# Patient Record
Sex: Female | Born: 1975 | Race: White | Hispanic: Yes | Marital: Married | Smoking: Never smoker
Health system: Southern US, Community
[De-identification: ages and names within clinical notes are randomized; demographics above are authoritative.]

## PROBLEM LIST (undated history)

## (undated) DIAGNOSIS — E0590 Thyrotoxicosis, unspecified without thyrotoxic crisis or storm: Secondary | ICD-10-CM

## (undated) DIAGNOSIS — N83209 Unspecified ovarian cyst, unspecified side: Secondary | ICD-10-CM

## (undated) DIAGNOSIS — Z789 Other specified health status: Secondary | ICD-10-CM

## (undated) DIAGNOSIS — R079 Chest pain, unspecified: Secondary | ICD-10-CM

## (undated) DIAGNOSIS — R110 Nausea: Secondary | ICD-10-CM

## (undated) DIAGNOSIS — R0602 Shortness of breath: Secondary | ICD-10-CM

## (undated) DIAGNOSIS — Z603 Acculturation difficulty: Secondary | ICD-10-CM

## (undated) DIAGNOSIS — J45909 Unspecified asthma, uncomplicated: Secondary | ICD-10-CM

## (undated) DIAGNOSIS — Z758 Other problems related to medical facilities and other health care: Secondary | ICD-10-CM

## (undated) DIAGNOSIS — R109 Unspecified abdominal pain: Secondary | ICD-10-CM

## (undated) DIAGNOSIS — I8393 Asymptomatic varicose veins of bilateral lower extremities: Secondary | ICD-10-CM

## (undated) DIAGNOSIS — G8929 Other chronic pain: Secondary | ICD-10-CM

## (undated) DIAGNOSIS — R4586 Emotional lability: Secondary | ICD-10-CM

## (undated) HISTORY — DX: Acculturation difficulty: Z60.3

## (undated) HISTORY — DX: Thyrotoxicosis, unspecified without thyrotoxic crisis or storm: E05.90

## (undated) HISTORY — DX: Unspecified ovarian cyst, unspecified side: N83.209

## (undated) HISTORY — DX: Other problems related to medical facilities and other health care: Z75.8

## (undated) HISTORY — DX: Unspecified abdominal pain: R10.9

## (undated) HISTORY — DX: Other chronic pain: G89.29

## (undated) HISTORY — DX: Shortness of breath: R06.02

## (undated) HISTORY — DX: Emotional lability: R45.86

## (undated) HISTORY — DX: Chest pain, unspecified: R07.9

## (undated) HISTORY — DX: Nausea: R11.0

## (undated) HISTORY — PX: MYOMECTOMY: SHX85

## (undated) HISTORY — DX: Asymptomatic varicose veins of bilateral lower extremities: I83.93

## (undated) HISTORY — DX: Other specified health status: Z78.9

## (undated) HISTORY — DX: Unspecified asthma, uncomplicated: J45.909

---

## 2018-06-08 ENCOUNTER — Emergency Department (HOSPITAL_COMMUNITY): Payer: No Typology Code available for payment source

## 2018-06-08 ENCOUNTER — Other Ambulatory Visit: Payer: Self-pay

## 2018-06-08 ENCOUNTER — Ambulatory Visit (INDEPENDENT_AMBULATORY_CARE_PROVIDER_SITE_OTHER): Payer: No Typology Code available for payment source | Admitting: Family Medicine

## 2018-06-08 ENCOUNTER — Encounter: Payer: Self-pay | Admitting: Family Medicine

## 2018-06-08 ENCOUNTER — Emergency Department (HOSPITAL_COMMUNITY)
Admission: EM | Admit: 2018-06-08 | Discharge: 2018-06-08 | Disposition: A | Discharge status: Home / Self Care | Payer: No Typology Code available for payment source | Attending: Emergency Medicine | Admitting: Emergency Medicine

## 2018-06-08 VITALS — BP 110/70 | HR 62 | Temp 97.5°F | Ht 62.0 in | Wt 145.0 lb

## 2018-06-08 DIAGNOSIS — R0602 Shortness of breath: Secondary | ICD-10-CM | POA: Insufficient documentation

## 2018-06-08 DIAGNOSIS — G8929 Other chronic pain: Secondary | ICD-10-CM

## 2018-06-08 DIAGNOSIS — E039 Hypothyroidism, unspecified: Secondary | ICD-10-CM | POA: Insufficient documentation

## 2018-06-08 DIAGNOSIS — Z7689 Persons encountering health services in other specified circumstances: Secondary | ICD-10-CM

## 2018-06-08 DIAGNOSIS — M549 Dorsalgia, unspecified: Secondary | ICD-10-CM | POA: Insufficient documentation

## 2018-06-08 DIAGNOSIS — Z79899 Other long term (current) drug therapy: Secondary | ICD-10-CM | POA: Insufficient documentation

## 2018-06-08 DIAGNOSIS — R079 Chest pain, unspecified: Secondary | ICD-10-CM | POA: Insufficient documentation

## 2018-06-08 DIAGNOSIS — Z09 Encounter for follow-up examination after completed treatment for conditions other than malignant neoplasm: Secondary | ICD-10-CM

## 2018-06-08 DIAGNOSIS — R110 Nausea: Secondary | ICD-10-CM | POA: Insufficient documentation

## 2018-06-08 DIAGNOSIS — Z0000 Encounter for general adult medical examination without abnormal findings: Secondary | ICD-10-CM

## 2018-06-08 DIAGNOSIS — Z131 Encounter for screening for diabetes mellitus: Secondary | ICD-10-CM

## 2018-06-08 LAB — CBC
HCT: 38.5 % (ref 36.0–46.0)
Hemoglobin: 12.4 g/dL (ref 12.0–15.0)
MCH: 26.8 pg (ref 26.0–34.0)
MCHC: 32.2 g/dL (ref 30.0–36.0)
MCV: 83.3 fL (ref 80.0–100.0)
Platelets: 275 10*3/uL (ref 150–400)
RBC: 4.62 MIL/uL (ref 3.87–5.11)
RDW: 12 % (ref 11.5–15.5)
WBC: 7.8 10*3/uL (ref 4.0–10.5)
nRBC: 0 % (ref 0.0–0.2)

## 2018-06-08 LAB — POCT URINALYSIS DIP (MANUAL ENTRY)
Bilirubin, UA: NEGATIVE
Blood, UA: NEGATIVE
Glucose, UA: NEGATIVE mg/dL
Ketones, POC UA: NEGATIVE mg/dL
Leukocytes, UA: NEGATIVE
Nitrite, UA: NEGATIVE
Protein Ur, POC: NEGATIVE mg/dL
Spec Grav, UA: >=1.03 — AB (ref 1.010–1.025)
Urobilinogen, UA: 0.2 E.U./dL
pH, UA: 5.5 (ref 5.0–8.0)

## 2018-06-08 LAB — BASIC METABOLIC PANEL
Anion gap: 10 (ref 5–15)
BUN: 8 mg/dL (ref 6–20)
CO2: 22 mmol/L (ref 22–32)
Calcium: 9 mg/dL (ref 8.9–10.3)
Chloride: 106 mmol/L (ref 98–111)
Creatinine, Ser: 0.71 mg/dL (ref 0.44–1.00)
GFR calc Af Amer: >60 mL/min (ref >60)
GFR calc non Af Amer: >60 mL/min (ref >60)
Glucose, Bld: 78 mg/dL (ref 70–99)
Potassium: 3.6 mmol/L (ref 3.5–5.1)
Sodium: 138 mmol/L (ref 135–145)

## 2018-06-08 LAB — POCT GLYCOSYLATED HEMOGLOBIN (HGB A1C): Hemoglobin A1C: 5.5 % (ref 4.0–5.6)

## 2018-06-08 LAB — TROPONIN I: Troponin I: <0.03 ng/mL (ref <0.03)

## 2018-06-08 LAB — POCT URINE PREGNANCY: Preg Test, Ur: NEGATIVE

## 2018-06-08 MED ORDER — NITROGLYCERIN 0.4 MG SL SUBL: 0.4000 mg | SUBLINGUAL_TABLET | SUBLINGUAL | 3 refills | Status: DC | PRN

## 2018-06-08 MED ORDER — ONDANSETRON HCL 4 MG PO TABS: 4.0000 mg | ORAL_TABLET | Freq: Three times a day (TID) | ORAL | 2 refills | Status: DC | PRN

## 2018-06-08 NOTE — ED Provider Notes (Signed)
[facility]   CSN: [id number] Arrival date & time: 06/08/18  1553    History   Chief Complaint Chief Complaint  Patient presents with  . Chest Pain    HPI Ronni RumbleGriselda Concepcion is a 43 y.o. female.     Patient is a 43 year old female with past medical history of chest pain, hypothyroidism who presents emergency department for chest pain.  Patient reports that this is been going on greater than 5 months.  Reports that it comes on randomly and is sharp and lasts for just a few seconds.  Reports that it is so painful it takes her breath away for a few seconds but then resolves on its own.  No exacerbating or relieving factors.  Reports that [place] and was told to follow-up with a cardiologist but never followed up.  Reports that she was at her primary care doctor's office today and when was having EKG done and reports that she had the pain while she was getting the EKG and her primary care doctor told her to come to the emergency department.  She currently has the pain well localized in the left side of her upper back and the right side of her lower back.  She appears in no acute distress.  She denies any diaphoresis, nausea, vomiting, family history of heart attack.     Past Medical History:  Diagnosis Date  . Chest pain   . Hyperthyroidism   . Mood changes   . Nausea   . Shortness of breath     There are no active problems to display for this patient.   No past surgical history on file.   OB History   No obstetric history on file.      Home Medications    Prior to Admission medications   Medication Sig Start Date End Date Taking? Authorizing Provider  levothyroxine (SYNTHROID) 50 MCG tablet Take 50 mcg by mouth daily before breakfast.    [provider]  nitroGLYCERIN (NITROSTAT) 0.4 MG SL tablet Place 1 tablet (0.4 mg total) under the tongue every 5  (five) minutes as needed for chest pain. 06/08/18   Kallie LocksStroud, Natalie M, FNP  ondansetron (ZOFRAN) 4 MG tablet Take 1 tablet (4 mg total) by mouth every 8 (eight) hours as needed for nausea or vomiting. 06/08/18   Kallie LocksStroud, Natalie M, FNP    Family History Family History  Problem Relation Age of Onset  . Hypotension Mother   . Thyroid disease Mother   . Hypercholesterolemia Father     Social History Social History   Tobacco Use  . Smoking status: Never Smoker  . Smokeless tobacco: Never Used  Substance Use Topics  . Alcohol use: Never    Frequency: Never  . Drug use: Never     Allergies   Patient has no known allergies.   Review of Systems Review of Systems  Constitutional: Negative for activity change, appetite change, chills and fever.  HENT: Negative for ear pain and sore throat.   Eyes: Negative for pain and visual disturbance.  Respiratory: Positive for chest tightness. Negative for cough and shortness of breath.   Cardiovascular: Positive for chest pain. Negative for palpitations and leg swelling.  Gastrointestinal: Negative for abdominal pain, diarrhea, nausea and vomiting.  Genitourinary: Negative for dysuria and hematuria.  Musculoskeletal: Negative for arthralgias and back pain.  Skin: Negative for color change and rash.  Neurological: Negative  for dizziness, seizures and syncope.  All other systems reviewed and are negative.    Physical Exam Updated Vital Signs BP 103/68   Pulse (!) 55   Temp 98.2 F (36.8 C) (Oral)   Resp 15   LMP 05/11/2018   SpO2 100%   Physical Exam Vitals signs and nursing note reviewed.  Constitutional:      General: She is not in acute distress.    Appearance: She is well-developed.  HENT:     Head: Normocephalic and atraumatic.  Eyes:     Conjunctiva/sclera: Conjunctivae normal.  Neck:     Musculoskeletal: Neck supple.  Cardiovascular:     Rate and Rhythm: Normal rate and regular rhythm.     Heart sounds: No murmur.   Pulmonary:     Effort: Pulmonary effort is normal. No respiratory distress.     Breath sounds: Normal breath sounds.    Abdominal:     Palpations: Abdomen is soft.     Tenderness: There is no abdominal tenderness.  Skin:    General: Skin is warm and dry.  Neurological:     Mental Status: She is alert.      ED Treatments / Results  Labs (all labs ordered are listed, but only abnormal results are displayed) Labs Reviewed  BASIC METABOLIC PANEL  CBC  TROPONIN I    EKG EKG Interpretation  Date/Time:  Tuesday June 08 2018 15:57:52 EDT Ventricular Rate:  63 PR Interval:    QRS Duration: 81 QT Interval:  407 QTC Calculation: 417 R Axis:   78 Text Interpretation:  Sinus rhythm Prolonged PR interval Confirmed by Virgina Norfolk [phone]) on 06/08/2018 4:00:50 PM   Radiology Dg Chest Port 1 View  Result Date: 06/08/2018 CLINICAL DATA:  Chest pain EXAM: PORTABLE CHEST 1 VIEW COMPARISON:  None. FINDINGS: The heart size and mediastinal contours are within normal limits. Both lungs are clear. The visualized skeletal structures are unremarkable. IMPRESSION: No active disease. Electronically Signed   By: Marlan Palau M.D.   On: 06/08/2018 17:24    Procedures Procedures (including critical care time)  Medications Ordered in ED Medications - No data to display   Initial Impression / Assessment and Plan / ED Course  I have reviewed the triage vital signs and the nursing notes.  Pertinent labs & imaging results that were available during my care of the patient were reviewed by me and considered in my medical decision making (see chart for details).  Clinical Course as of Jun 08 1739  Tue Jun 08, 2018  1740 Patient symptoms are more so consistent with musculoskeletal back pain and any cardiopulmonary etiology.  This is also been going on for several months at a time.  I think she should follow-up with the cardiologist as she was seeing one before and she should follow-up with her  primary care doctor.  Her heart score is very low and her troponin today is negative.   [KM]    Clinical Course User Index [KM] Arlyn Dunning, PA-C        Final Clinical Impressions(s) / ED Diagnoses   Final diagnoses:  Chest pain, unspecified type  Chronic bilateral back pain, unspecified back location    ED Discharge Orders    None       Jeral Pinch 06/08/18 1741    Virgina Norfolk, DO 06/08/18 1820

## 2018-06-08 NOTE — Discharge Instructions (Addendum)
Your lab work, EKG and chest x-ray look very good.  You should follow-up with your regular doctor and your cardiologist. Thank you for allowing me to care for you today. Please return to the emergency department if you have new or worsening symptoms. Take your medications as instructed.

## 2018-06-08 NOTE — ED Triage Notes (Signed)
Pt here from PCP office for ten minute episode of sharp, L sided chest pain at office visit today that radiates to back. Resolved without intervention. 324 ASA given PTA. Pain free with EMS and on arrival.

## 2018-06-08 NOTE — Patient Instructions (Signed)
Levothyroxine tablets What is this medicine? LEVOTHYROXINE (lee voe thye ROX een) is a thyroid hormone. This medicine can improve symptoms of thyroid deficiency such as slow speech, lack of energy, weight gain, hair loss, dry skin, and feeling cold. It also helps to treat goiter (an enlarged thyroid gland). It is also used to treat some kinds of thyroid cancer along with surgery and other medicines. This medicine may be used for other purposes; ask your health care provider or pharmacist if you have questions. COMMON BRAND NAME(S): Estre, Euthyrox, Levo-T, Levothroid, Levoxyl, Synthroid, Thyro-Tabs, Unithroid What should I tell my health care provider before I take this medicine? They need to know if you have any of these conditions: -Addison's disease or other adrenal gland problem -angina -bone problems -diabetes -dieting or on a weight loss program -fertility problems -heart disease -pituitary gland problem -take medicines that treat or prevent blood clots -an unusual or allergic reaction to levothyroxine, thyroid hormones, other medicines, foods, dyes, or preservatives -pregnant or trying to get pregnant -breast-feeding How should I use this medicine? Take this medicine by mouth with plenty of water. It is best to take on an empty stomach, at least 30 minutes before or 2 hours after food. Follow the directions on the prescription label. Take at the same time each day. Do not take your medicine more often than directed. Contact your pediatrician regarding the use of this medicine in children. While this drug may be prescribed for children and infants as young as a few days of age for selected conditions, precautions do apply. For infants, you may crush the tablet and place in a small amount of (5-10 ml or 1 to 2 teaspoonfuls) of water, breast milk, or non-soy based infant formula. Do not mix with soy-based infant formula. Give as directed. Overdosage: If you think you have taken too much of  this medicine contact a poison control center or emergency room at once. NOTE: This medicine is only for you. Do not share this medicine with others. What if I miss a dose? If you miss a dose, take it as soon as you can. If it is almost time for your next dose, take only that dose. Do not take double or extra doses. What may interact with this medicine? -amiodarone -antacids -anti-thyroid medicines -calcium supplements -carbamazepine -certain medicines for depression -certain medicines to treat cancer -cholestyramine -clofibrate -colesevelam -colestipol -digoxin -female hormones, like estrogens or progestins and birth control pills, patches, rings, or injections -iron supplements -kayexylate -ketamine -liquid nutrition products like Ensure -lithium -medicines for colds and breathing difficulties -medicines for diabetes -medicines or dietary supplements for weight loss -methadone -niacin -orlistat -oxandrolone -phenobarbital or other barbiturates -phenytoin -rifampin -sevelamer -simethicone -soy isoflavones -steroid medicines like prednisone or cortisone -sucralfate -testosterone -theophylline -warfarin This list may not describe all possible interactions. Give your health care provider a list of all the medicines, herbs, non-prescription drugs, or dietary supplements you use. Also tell them if you smoke, drink alcohol, or use illegal drugs. Some items may interact with your medicine. What should I watch for while using this medicine? Be sure to take this medicine with plenty of fluids. Some tablets may cause choking, gagging, or difficulty swallowing from the tablet getting stuck in your throat. Most of these problems disappear if the medicine is taken with the right amount of water or other fluids. Do not switch brands of this medicine unless your health care professional agrees with the change. Ask questions if you are uncertain. You will need  regular exams and  occasional blood tests to check the response to treatment. If you are receiving this medicine for an underactive thyroid, it may be several weeks before you notice an improvement. Check with your doctor or health care professional if your symptoms do not improve. It may be necessary for you to take this medicine for the rest of your life. Do not stop using this medicine unless your doctor or health care professional advises you to. This medicine can affect blood sugar levels. If you have diabetes, check your blood sugar as directed. You may lose some of your hair when you first start treatment. With time, this usually corrects itself. If you are going to have surgery, tell your doctor or health care professional that you are taking this medicine. What side effects may I notice from receiving this medicine? Side effects that you should report to your doctor or health care professional as soon as possible: -allergic reactions like skin rash, itching or hives, swelling of the face, lips, or tongue -anxious -breathing problems -changes in menstrual periods -chest pain -diarrhea -excessive sweating or intolerance to heat -fast or irregular heartbeat -leg cramps -nervousness -swelling of ankles, feet, or legs -tremors -trouble sleeping -vomiting Side effects that usually do not require medical attention (report to your doctor or health care professional if they continue or are bothersome): -changes in appetite -headache -irritable -nausea -weight loss This list may not describe all possible side effects. Call your doctor for medical advice about side effects. You may report side effects to FDA at 1-800-FDA-1088. Where should I keep my medicine? Keep out of the reach of children. Store at room temperature between 15 and 30 degrees C (59 and 86 degrees F). Protect from light and moisture. Keep container tightly closed. Throw away any unused medicine after the expiration date. NOTE: This sheet  is a summary. It may not cover all possible information. If you have questions about this medicine, talk to your doctor, pharmacist, or health care provider.  2019 Elsevier/Gold Standard (2016-03-17 15:39:30) Hypothyroidism  Hypothyroidism is when the thyroid gland does not make enough of certain hormones (it is underactive). The thyroid gland is a small gland located in the lower front part of the neck, just in front of the windpipe (trachea). This gland makes hormones that help control how the body uses food for energy (metabolism) as well as how the heart and brain function. These hormones also play a role in keeping your bones strong. When the thyroid is underactive, it produces too little of the hormones thyroxine (T4) and triiodothyronine (T3). What are the causes? This condition may be caused by:  Hashimoto's disease. This is a disease in which the body's disease-fighting system (immune system) attacks the thyroid gland. This is the most common cause.  Viral infections.  Pregnancy.  Certain medicines.  Birth defects.  Past radiation treatments to the head or neck for cancer.  Past treatment with radioactive iodine.  Past exposure to radiation in the environment.  Past surgical removal of part or all of the thyroid.  Problems with a gland in the center of the brain (pituitary gland).  Lack of enough iodine in the diet. What increases the risk? You are more likely to develop this condition if:  You are female.  You have a family history of thyroid conditions.  You use a medicine called lithium.  You take medicines that affect the immune system (immunosuppressants). What are the signs or symptoms? Symptoms of this condition include:  Feeling as though you have no energy (lethargy).  Not being able to tolerate cold.  Weight gain that is not explained by a change in diet or exercise habits.  Lack of appetite.  Dry skin.  Coarse hair.  Menstrual irregularity.   Slowing of thought processes.  Constipation.  Sadness or depression. How is this diagnosed? This condition may be diagnosed based on:  Your symptoms, your medical history, and a physical exam.  Blood tests. You may also have imaging tests, such as an ultrasound or MRI. How is this treated? This condition is treated with medicine that replaces the thyroid hormones that your body does not make. After you begin treatment, it may take several weeks for symptoms to go away. Follow these instructions at home:  Take over-the-counter and prescription medicines only as told by your health care provider.  If you start taking any new medicines, tell your health care provider.  Keep all follow-up visits as told by your health care provider. This is important. ? As your condition improves, your dosage of thyroid hormone medicine may change. ? You will need to have blood tests regularly so that your health care provider can monitor your condition. Contact a health care provider if:  Your symptoms do not get better with treatment.  You are taking thyroid replacement medicine and you: ? Sweat a lot. ? Have tremors. ? Feel anxious. ? Lose weight rapidly. ? Cannot tolerate heat. ? Have emotional swings. ? Have diarrhea. ? Feel weak. Get help right away if you have:  Chest pain.  An irregular heartbeat.  A rapid heartbeat.  Difficulty breathing. Summary  Hypothyroidism is when the thyroid gland does not make enough of certain hormones (it is underactive).  When the thyroid is underactive, it produces too little of the hormones thyroxine (T4) and triiodothyronine (T3).  The most common cause is Hashimoto's disease, a disease in which the body's disease-fighting system (immune system) attacks the thyroid gland. The condition can also be caused by viral infections, medicine, pregnancy, or past radiation treatment to the head or neck.  Symptoms may include weight gain, dry skin,  constipation, feeling as though you do not have energy, and not being able to tolerate cold.  This condition is treated with medicine to replace the thyroid hormones that your body does not make. This information is not intended to replace advice given to you by your health care provider. Make sure you discuss any questions you have with your health care provider. Document Released: 02/03/2005 Document Revised: 01/14/2017 Document Reviewed: 01/14/2017 Elsevier Interactive Patient Education  2019 [employer] Nonspecific Chest Pain Chest pain can be caused by many different conditions. Some causes of chest pain can be life-threatening. These will require treatment right away. Serious causes of chest pain include:  Heart attack.  A tear in the body's main blood vessel.  Redness and swelling (inflammation) around your heart.  Blood clot in your lungs. Other causes of chest pain may not be so serious. These include:  Heartburn.  Anxiety or stress.  Damage to bones or muscles in your chest.  Lung infections. Chest pain can feel like:  Pain or discomfort in your chest.  Crushing, pressure, aching, or squeezing pain.  Burning or tingling.  Dull or sharp pain that is worse when you move, cough, or take a deep breath.  Pain or discomfort that is also felt in your back, neck, jaw, shoulder, or arm, or pain that spreads to any of these areas. It is  hard to know whether your pain is caused by something that is serious or something that is not so serious. So it is important to see your doctor right away if you have chest pain. Follow these instructions at home: Medicines  Take over-the-counter and prescription medicines only as told by your doctor.  If you were prescribed an antibiotic medicine, take it as told by your doctor. Do not stop taking the antibiotic even if you start to feel better. Lifestyle   Rest as told by your doctor.  Do not use any products that contain nicotine  or tobacco, such as cigarettes, e-cigarettes, and chewing tobacco. If you need help quitting, ask your doctor.  Do not drink alcohol.  Make lifestyle changes as told by your doctor. These may include: ? Getting regular exercise. Ask your doctor what activities are safe for you. ? Eating a heart-healthy diet. A diet and nutrition specialist (dietitian) can help you to learn healthy eating options. ? Staying at a healthy weight. ? Treating diabetes or high blood pressure, if needed. ? Lowering your stress. Activities such as yoga and relaxation techniques can help. General instructions  Pay attention to any changes in your symptoms. Tell your doctor about them or any new symptoms.  Avoid any activities that cause chest pain.  Keep all follow-up visits as told by your doctor. This is important. You may need more testing if your chest pain does not go away. Contact a doctor if:  Your chest pain does not go away.  You feel depressed.  You have a fever. Get help right away if:  Your chest pain is worse.  You have a cough that gets worse, or you cough up blood.  You have very bad (severe) pain in your belly (abdomen).  You pass out (faint).  You have either of these for no clear reason: ? Sudden chest discomfort. ? Sudden discomfort in your arms, back, neck, or jaw.  You have shortness of breath at any time.  You suddenly start to sweat, or your skin gets clammy.  You feel sick to your stomach (nauseous).  You throw up (vomit).  You suddenly feel lightheaded or dizzy.  You feel very weak or tired.  Your heart starts to beat fast, or it feels like it is skipping beats. These symptoms may be an emergency. Do not wait to see if the symptoms will go away. Get medical help right away. Call your local emergency services (911 in the [place] Do not drive yourself to the hospital. Summary  Chest pain can be caused by many different conditions. The cause may be serious and need  treatment right away. If you have chest pain, see your doctor right away.  Follow your doctor's instructions for taking medicines and making lifestyle changes.  Keep all follow-up visits as told by your doctor. This includes visits for any further testing if your chest pain does not go away.  Be sure to know the signs that show that your condition has become worse. Get help right away if you have these symptoms. This information is not intended to replace advice given to you by your health care provider. Make sure you discuss any questions you have with your health care provider. Document Released: 07/23/2007 Document Revised: 08/06/2017 Document Reviewed: 08/06/2017 Elsevier Interactive Patient Education  2019 [employer]. Nitroglycerin sublingual tablets What is this medicine? NITROGLYCERIN (nye troe GLI ser in) is a type of vasodilator. It relaxes blood vessels, increasing the blood and oxygen supply  to your heart. This medicine is used to relieve chest pain caused by angina. It is also used to prevent chest pain before activities like climbing stairs, going outdoors in cold weather, or sexual activity. This medicine may be used for other purposes; ask your health care provider or pharmacist if you have questions. COMMON BRAND NAME(S): Nitroquick, Nitrostat, Nitrotab What should I tell my health care provider before I take this medicine? They need to know if you have any of these conditions: -anemia -head injury, recent stroke, or bleeding in the brain -liver disease -previous heart attack -an unusual or allergic reaction to nitroglycerin, other medicines, foods, dyes, or preservatives -pregnant or trying to get pregnant -breast-feeding How should I use this medicine? Take this medicine by mouth as needed. At the first sign of an angina attack (chest pain or tightness) place one tablet under your tongue. You can also take this medicine 5 to 10 minutes before an event likely to produce  chest pain. Follow the directions on the prescription label. Let the tablet dissolve under the tongue. Do not swallow whole. Replace the dose if you accidentally swallow it. It will help if your mouth is not dry. Saliva around the tablet will help it to dissolve more quickly. Do not eat or drink, smoke or chew tobacco while a tablet is dissolving. If you are not better within 5 minutes after taking ONE dose of nitroglycerin, call 9-1-1 immediately to seek emergency medical care. Do not take more than 3 nitroglycerin tablets over 15 minutes. If you take this medicine often to relieve symptoms of angina, your doctor or health care professional may provide you with different instructions to manage your symptoms. If symptoms do not go away after following these instructions, it is important to call 9-1-1 immediately. Do not take more than 3 nitroglycerin tablets over 15 minutes. Talk to your pediatrician regarding the use of this medicine in children. Special care may be needed. Overdosage: If you think you have taken too much of this medicine contact a poison control center or emergency room at once. NOTE: This medicine is only for you. Do not share this medicine with others. What if I miss a dose? This does not apply. This medicine is only used as needed. What may interact with this medicine? Do not take this medicine with any of the following medications: -certain migraine medicines like ergotamine and dihydroergotamine (DHE) -medicines used to treat erectile dysfunction like sildenafil, tadalafil, and vardenafil -riociguat This medicine may also interact with the following medications: -alteplase -aspirin -heparin -medicines for high blood pressure -medicines for mental depression -other medicines used to treat angina -phenothiazines like chlorpromazine, mesoridazine, prochlorperazine, thioridazine This list may not describe all possible interactions. Give your health care provider a list of all  the medicines, herbs, non-prescription drugs, or dietary supplements you use. Also tell them if you smoke, drink alcohol, or use illegal drugs. Some items may interact with your medicine. What should I watch for while using this medicine? Tell your doctor or health care professional if you feel your medicine is no longer working. Keep this medicine with you at all times. Sit or lie down when you take your medicine to prevent falling if you feel dizzy or faint after using it. Try to remain calm. This will help you to feel better faster. If you feel dizzy, take several deep breaths and lie down with your feet propped up, or bend forward with your head resting between your knees. You may get drowsy  or dizzy. Do not drive, use machinery, or do anything that needs mental alertness until you know how this drug affects you. Do not stand or sit up quickly, especially if you are an older patient. This reduces the risk of dizzy or fainting spells. Alcohol can make you more drowsy and dizzy. Avoid alcoholic drinks. Do not treat yourself for coughs, colds, or pain while you are taking this medicine without asking your doctor or health care professional for advice. Some ingredients may increase your blood pressure. What side effects may I notice from receiving this medicine? Side effects that you should report to your doctor or health care professional as soon as possible: -blurred vision -dry mouth -skin rash -sweating -the feeling of extreme pressure in the head -unusually weak or tired Side effects that usually do not require medical attention (report to your doctor or health care professional if they continue or are bothersome): -flushing of the face or neck -headache -irregular heartbeat, palpitations -nausea, vomiting This list may not describe all possible side effects. Call your doctor for medical advice about side effects. You may report side effects to FDA at 1-800-FDA-1088. Where should I keep my  medicine? Keep out of the reach of children. Store at room temperature between 20 and 25 degrees C (68 and 77 degrees F). Store in Retail buyer. Protect from light and moisture. Keep tightly closed. Throw away any unused medicine after the expiration date. NOTE: This sheet is a summary. It may not cover all possible information. If you have questions about this medicine, talk to your doctor, pharmacist, or health care provider.  2019 [employer]vier/Gold Standard (2012-12-02 17:57:36)

## 2018-06-08 NOTE — Progress Notes (Signed)
Patient Veronica Jensen [facility]   New Patient--Establish Care  Subjective:  Patient ID: Veronica Jensen, female    DOB: December 08, 1975  Age: 43 y.o. MRN: [id number]  CC:  Chief Complaint  Patient presents with  . Establish Care    HPI [name] is a 43 year old female who presents to [facility] today.   Past Medical History:  Diagnosis Date  . Chest pain   . Hyperthyroidism   . Mood changes   . Nausea   . Shortness of breath    Current Status: Since her last office visit, she has recently relocated from [place] months ago with her husband. She is accompanied today by her husband and we are also using [employer] and via phone today. She states that she has been nauseous X 2 weeks ago. She states that nausea usually happens at night and also intermittently. She has not taken any medication for relief. No reports of GI problems such as vomiting, diarrhea, and constipation. She has no reports of blood in stools, dysuria and hematuria. She states that she was followed by Cardiologist about 10 years ago, but is unsure as to what her diagnosis was at that time. She states that she occasionally has sharpe left chest pain, which radiates around to her right upper flank area/back area. She also reports shortness of breath, which lasts a few seconds. She experiences an episode of this chest pain while in the office today. She has a history of hypothyroidism. She reports thinning hair, increased fatigue, and mood changes. She denies fatigue, inability to tolerate cold, unexplained weight gain, dry skin, coarse hair, heart palpitations, decrease thought process, constipation, depression. She has moderate anxiety r/t chest discomfort today. She reports occasional mood changes. She denies suicidal ideations, homicidal ideations, or auditory hallucinations.  She denies fevers, chills, fatigue, recent infections, weight loss, and night  sweats. She has not had any headaches, visual changes, dizziness, and falls. Denies heart palpitations, and cough reported.   History reviewed. No pertinent surgical history.  Family History  Problem Relation Age of Onset  . Hypotension Mother   . Thyroid disease Mother   . Hypercholesterolemia Father     Social History   Socioeconomic History  . Marital status: Married    Spouse name: Not on file  . Number of children: Not on file  . Years of education: Not on file  . Highest education level: Not on file  Occupational History  . Not on file  Social Needs  . Financial resource strain: Not on file  . Food insecurity:    Worry: Not on file    Inability: Not on file  . Transportation needs:    Medical: Not on file    Non-medical: Not on file  Tobacco Use  . Smoking status: Never Smoker  . Smokeless tobacco: Never Used  Substance and Sexual Activity  . Alcohol use: Never    Frequency: Never  . Drug use: Never  . Sexual activity: Not on file  Lifestyle  . Physical activity:    Days per week: Not on file    Minutes per session: Not on file  . Stress: Not on file  Relationships  . Social connections:    Talks on phone: Not on file    Gets together: Not on file    Attends religious service: Not on file    Active member of club or organization: Not on file    Attends  meetings of clubs or organizations: Not on file    Relationship status: Not on file  . Intimate partner violence:    Fear of current or ex partner: Not on file    Emotionally abused: Not on file    Physically abused: Not on file    Forced sexual activity: Not on file  Other Topics Concern  . Not on file  Social History Narrative  . Not on file    Outpatient Medications Prior to Visit  Medication Sig Dispense Refill  . levothyroxine (SYNTHROID) 50 MCG tablet Take 50 mcg by mouth daily before breakfast.     No facility-administered medications prior to visit.     No Known Allergies  ROS Review  of Systems  Constitutional: Positive for fatigue.  HENT: Negative.   Eyes: Negative.   Respiratory: Positive for shortness of breath (occasional).   Cardiovascular: Positive for chest pain (occasional).  Gastrointestinal: Positive for nausea.  Endocrine: Negative.   Genitourinary: Negative.   Musculoskeletal: Negative.   Skin: Negative.   Allergic/Immunologic: Negative.   Neurological: Negative.   Hematological: Negative.   Psychiatric/Behavioral: Negative.    Objective:    Physical Exam  Constitutional: She is oriented to person, place, and time. She appears well-developed and well-nourished.  Eyes: Conjunctivae are normal.  Neck: Normal range of motion. Neck supple.  Cardiovascular: Normal rate, regular rhythm, normal heart sounds and intact distal pulses.  Pulmonary/Chest: Effort normal and breath sounds normal.  Abdominal: Soft. Bowel sounds are normal.  Musculoskeletal: Normal range of motion.  Neurological: She is alert and oriented to person, place, and time. She has normal reflexes.  Skin: Skin is warm and dry.  Psychiatric: She has a normal mood and affect. Her behavior is normal. Judgment and thought content normal.  Nursing note and vitals reviewed.   BP 110/70 (BP Location: Right Arm, Patient Position: Sitting, Cuff Size: Small)   Pulse 62   Temp (!) 97.5 F (36.4 C) (Oral)   Ht '5\' 2"'$  (1.575 m)   Wt 145 lb (65.8 kg)   LMP 05/11/2018   SpO2 100%   BMI 26.52 kg/m  Wt Readings from Last 3 Encounters:  06/08/18 145 lb (65.8 kg)     Health Maintenance Due  Topic Date Due  . HIV Screening  12/18/1989  . TETANUS/TDAP  12/18/1993  . PAP SMEAR-Modifier  12/19/1995    There are no preventive care reminders to display for this patient.  No results found for: TSH No results found for: WBC, HGB, HCT, MCV, PLT No results found for: NA, K, CHLORIDE, CO2, GLUCOSE, BUN, CREATININE, BILITOT, ALKPHOS, AST, ALT, PROT, ALBUMIN, CALCIUM, ANIONGAP, EGFR, GFR No results  found for: CHOL No results found for: HDL No results found for: LDLCALC No results found for: TRIG No results found for: [facility] Lab Results  Component Value Date   HGBA1C 5.5 06/08/2018    Assessment & Plan:   1. Encounter to establish care  2. Hypothyroidism, unspecified type We will draw Thyroid panel today  3. Chest pain, unspecified type ECG reveals 1st degree AV Bundle Branch Block today. Patient experiences intermittent chest pain at office visit. EMS contacted and patient transported to ED today for further evaluation of chest pain.  - nitroGLYCERIN (NITROSTAT) 0.4 MG SL tablet; Place 1 tablet (0.4 mg total) under the tongue every 5 (five) minutes as needed for chest pain.  Dispense: 50 tablet; Refill: 3  4. Shortness of breath Stable at time of transport.  - nitroGLYCERIN (NITROSTAT) 0.4  MG SL tablet; Place 1 tablet (0.4 mg total) under the tongue every 5 (five) minutes as needed for chest pain.  Dispense: 50 tablet; Refill: 3  5. Nausea Pregnancy test is negative. We will initiate Zofran today.  - POCT urine pregnancy - ondansetron (ZOFRAN) 4 MG tablet; Take 1 tablet (4 mg total) by mouth every 8 (eight) hours as needed for nausea or vomiting.  Dispense: 20 tablet; Refill: 2  6. Screening for diabetes mellitus Hgb A1c is normal today.  - POCT glycosylated hemoglobin (Hb A1C) - POCT urinalysis dipstick  7. Healthcare maintenance Results are pending.  - CBC with Differential - Comprehensive metabolic panel - Lipid Panel - Thyroid Panel With TSH - Vitamin D, 25-hydroxy - Vitamin B12  8. Follow up She will follow up in 1 month.   Meds ordered this encounter  Medications  . ondansetron (ZOFRAN) 4 MG tablet    Sig: Take 1 tablet (4 mg total) by mouth every 8 (eight) hours as needed for nausea or vomiting.    Dispense:  20 tablet    Refill:  2  . nitroGLYCERIN (NITROSTAT) 0.4 MG SL tablet    Sig: Place 1 tablet (0.4 mg total) under the tongue every 5 (five)  minutes as needed for chest pain.    Dispense:  50 tablet    Refill:  3    Orders Placed This Encounter  Procedures  . CBC with Differential  . Comprehensive metabolic panel  . Lipid Panel  . Thyroid Panel With TSH  . Vitamin D, 25-hydroxy  . Vitamin B12  . POCT glycosylated hemoglobin (Hb A1C)  . POCT urinalysis dipstick  . POCT urine pregnancy    Referral Orders  No referral(s) requested today    Kathe Becton,  MSN, FNP-C Patient [address]  [place], [place] [address] [phone]  Meds ordered this encounter  Medications  . ondansetron (ZOFRAN) 4 MG tablet    Sig: Take 1 tablet (4 mg total) by mouth every 8 (eight) hours as needed for nausea or vomiting.    Dispense:  20 tablet    Refill:  2  . nitroGLYCERIN (NITROSTAT) 0.4 MG SL tablet    Sig: Place 1 tablet (0.4 mg total) under the tongue every 5 (five) minutes as needed for chest pain.    Dispense:  50 tablet    Refill:  3    Orders Placed This Encounter  Procedures  . CBC with Differential  . Comprehensive metabolic panel  . Lipid Panel  . Thyroid Panel With TSH  . Vitamin D, 25-hydroxy  . Vitamin B12  . POCT glycosylated hemoglobin (Hb A1C)  . POCT urinalysis dipstick  . POCT urine pregnancy    Referral Orders  No referral(s) requested today    Kathe Becton,  MSN, FNP-C Patient [address], [place] [address] [phone]       Problem List Items Addressed This Visit    None    Visit Diagnoses    Encounter to establish care    -  Primary   Hypothyroidism, unspecified type       Relevant Medications   levothyroxine (SYNTHROID) 50 MCG tablet   Chest pain, unspecified type       Relevant Medications   nitroGLYCERIN (NITROSTAT) 0.4 MG SL tablet   Shortness of breath       Relevant Medications   nitroGLYCERIN (NITROSTAT) 0.4 MG SL tablet  Nausea       Relevant Medications    ondansetron (ZOFRAN) 4 MG tablet   Other Relevant Orders   POCT urine pregnancy (Completed)   Screening for diabetes mellitus       Relevant Orders   POCT glycosylated hemoglobin (Hb A1C) (Completed)   POCT urinalysis dipstick (Completed)   Healthcare maintenance       Relevant Orders   CBC with Differential   Comprehensive metabolic panel   Lipid Panel   Thyroid Panel With TSH   Vitamin D, 25-hydroxy   Vitamin B12   Follow up          Meds ordered this encounter  Medications  . ondansetron (ZOFRAN) 4 MG tablet    Sig: Take 1 tablet (4 mg total) by mouth every 8 (eight) hours as needed for nausea or vomiting.    Dispense:  20 tablet    Refill:  2  . nitroGLYCERIN (NITROSTAT) 0.4 MG SL tablet    Sig: Place 1 tablet (0.4 mg total) under the tongue every 5 (five) minutes as needed for chest pain.    Dispense:  50 tablet    Refill:  3    Follow-up: Return in about 1 month (around 07/08/2018).    Azzie Glatter, FNP

## 2018-06-08 NOTE — ED Notes (Signed)
..  Patient verbalizes understanding of discharge instructions. Opportunity for questioning and answers were provided via interpreter. Armband removed by staff, pt discharged from ED ambulatory.

## 2018-06-09 LAB — CBC WITH DIFFERENTIAL/PLATELET
Basophils Absolute: 0.1 10*3/uL (ref 0.0–0.2)
Basos: 1 %
EOS (ABSOLUTE): 0.1 10*3/uL (ref 0.0–0.4)
Eos: 1 %
Hematocrit: 38 % (ref 34.0–46.6)
Hemoglobin: 12.5 g/dL (ref 11.1–15.9)
Immature Grans (Abs): 0 10*3/uL (ref 0.0–0.1)
Immature Granulocytes: 0 %
Lymphocytes Absolute: 2.6 10*3/uL (ref 0.7–3.1)
Lymphs: 37 %
MCH: 27 pg (ref 26.6–33.0)
MCHC: 32.9 g/dL (ref 31.5–35.7)
MCV: 82 fL (ref 79–97)
Monocytes Absolute: 0.4 10*3/uL (ref 0.1–0.9)
Monocytes: 5 %
Neutrophils Absolute: 3.9 10*3/uL (ref 1.4–7.0)
Neutrophils: 56 %
Platelets: 335 10*3/uL (ref 150–450)
RBC: 4.63 x10E6/uL (ref 3.77–5.28)
RDW: 12.3 % (ref 11.7–15.4)
WBC: 7.1 10*3/uL (ref 3.4–10.8)

## 2018-06-09 LAB — COMPREHENSIVE METABOLIC PANEL
ALT: 13 IU/L (ref 0–32)
AST: 20 IU/L (ref 0–40)
Albumin/Globulin Ratio: 1.7 (ref 1.2–2.2)
Albumin: 4.3 g/dL (ref 3.8–4.8)
Alkaline Phosphatase: 61 IU/L (ref 39–117)
BUN/Creatinine Ratio: 14 (ref 9–23)
BUN: 10 mg/dL (ref 6–24)
Bilirubin Total: 0.7 mg/dL (ref 0.0–1.2)
CO2: 23 mmol/L (ref 20–29)
Calcium: 9.1 mg/dL (ref 8.7–10.2)
Chloride: 102 mmol/L (ref 96–106)
Creatinine, Ser: 0.69 mg/dL (ref 0.57–1.00)
GFR calc Af Amer: 123 mL/min/{1.73_m2} (ref >59)
GFR calc non Af Amer: 107 mL/min/{1.73_m2} (ref >59)
Globulin, Total: 2.6 g/dL (ref 1.5–4.5)
Glucose: 77 mg/dL (ref 65–99)
Potassium: 4 mmol/L (ref 3.5–5.2)
Sodium: 139 mmol/L (ref 134–144)
Total Protein: 6.9 g/dL (ref 6.0–8.5)

## 2018-06-09 LAB — LIPID PANEL
Chol/HDL Ratio: 3 ratio (ref 0.0–4.4)
Cholesterol, Total: 185 mg/dL (ref 100–199)
HDL: 61 mg/dL (ref >39)
LDL Calculated: 110 mg/dL — ABNORMAL HIGH (ref 0–99)
Triglycerides: 71 mg/dL (ref 0–149)
VLDL Cholesterol Cal: 14 mg/dL (ref 5–40)

## 2018-06-09 LAB — VITAMIN B12: Vitamin B-12: 695 pg/mL (ref 232–1245)

## 2018-06-09 LAB — THYROID PANEL WITH TSH
Free Thyroxine Index: 1.9 (ref 1.2–4.9)
T3 Uptake Ratio: 23 % — ABNORMAL LOW (ref 24–39)
T4, Total: 8.4 ug/dL (ref 4.5–12.0)
TSH: 2.06 u[IU]/mL (ref 0.450–4.500)

## 2018-06-09 LAB — VITAMIN D 25 HYDROXY (VIT D DEFICIENCY, FRACTURES): Vit D, 25-Hydroxy: 39.9 ng/mL (ref 30.0–100.0)

## 2018-06-14 ENCOUNTER — Encounter: Payer: Self-pay | Admitting: Family Medicine

## 2018-06-14 ENCOUNTER — Other Ambulatory Visit: Payer: Self-pay | Admitting: Family Medicine

## 2018-06-14 ENCOUNTER — Telehealth: Payer: Self-pay

## 2018-06-14 DIAGNOSIS — E039 Hypothyroidism, unspecified: Secondary | ICD-10-CM

## 2018-06-14 MED ORDER — LEVOTHYROXINE SODIUM 50 MCG PO TABS: 50.0000 ug | ORAL_TABLET | Freq: Every day | ORAL | 3 refills | Status: DC

## 2018-06-14 NOTE — Telephone Encounter (Signed)
Is it okay to refill the Synthroid .

## 2018-06-15 NOTE — Telephone Encounter (Signed)
Patient notified and will pick up medication  

## 2018-06-16 ENCOUNTER — Encounter: Payer: Self-pay | Admitting: Family Medicine

## 2018-06-30 ENCOUNTER — Telehealth: Payer: Self-pay | Admitting: Internal Medicine

## 2018-06-30 NOTE — Telephone Encounter (Signed)
Mychart pending, smartphone, pre reg complete 06/30/18 AF

## 2018-07-01 ENCOUNTER — Telehealth (INDEPENDENT_AMBULATORY_CARE_PROVIDER_SITE_OTHER): Payer: No Typology Code available for payment source | Admitting: Internal Medicine

## 2018-07-01 ENCOUNTER — Encounter: Payer: Self-pay | Admitting: Internal Medicine

## 2018-07-01 VITALS — Ht 62.0 in

## 2018-07-01 DIAGNOSIS — G8929 Other chronic pain: Secondary | ICD-10-CM

## 2018-07-01 DIAGNOSIS — R002 Palpitations: Secondary | ICD-10-CM

## 2018-07-01 DIAGNOSIS — R072 Precordial pain: Secondary | ICD-10-CM

## 2018-07-01 DIAGNOSIS — M546 Pain in thoracic spine: Secondary | ICD-10-CM

## 2018-07-01 DIAGNOSIS — R9439 Abnormal result of other cardiovascular function study: Secondary | ICD-10-CM

## 2018-07-01 MED ORDER — METOPROLOL TARTRATE 100 MG PO TABS: 100.0000 mg | ORAL_TABLET | Freq: Once | ORAL | 0 refills | Status: DC

## 2018-07-01 NOTE — Progress Notes (Signed)
Virtual Visit via Video Note   This visit type was conducted due to national recommendations for restrictions regarding the COVID-19 Pandemic (e.g. social distancing) in an effort to limit this patient's exposure and mitigate transmission in our community.  Due to her co-morbid illnesses, this patient is at least at moderate risk for complications without adequate follow up.  This format is felt to be most appropriate for this patient at this time.  All issues noted in this document were discussed and addressed.  A limited physical exam was performed with this format.  Please refer to the patient's chart for her consent to telehealth for [facility].   Date:  07/01/2018   ID:  Veronica Jensen, DOB 1975/02/22, MRN [id number]  Patient Location: Home Provider Location: Home  PCP:  Kallie Locks, FNP  Cardiologist:  Parke Poisson, MD  Electrophysiologist:  None   Evaluation Performed:  New Patient Evaluation  Chief Complaint:  Chest pain  History of Present Illness:    Veronica Jensen is a 43 y.o. female with hx of hyperthyroidism and possible history of CAD by stress test performed 10 years ago outside the [place], with no cath or stent placement per patient recollection. She presents for evaluation of chest pain. She has multiple concerns we discussed today.   She notes that when she is going to bed at night she will notice her heart racing, but it does not wake her from sleep.   Her primary concern is a sharp, low thoracic back pain that per her description radiates anteriorly. It occasionally involves her chest as well. She describes this pain as sharp and sudden, feeling as if electricity is going up her spine. It causes her to catch her breath, and has been going on for several months. Not exacerbated by deep inspiration.  She also describes a sensation of a needle in her chest, it will occur a couple times a week and lasts only for a few seconds. It causes her to catch  her breath and stop what she's doing. She presented to the ED with this discomfort and was ruled out for MI in April. No known family history of early MI or SCD.  The patient denies dyspnea at rest or with exertion, PND, orthopnea, or leg swelling. Denies syncope or presyncope. Denies dizziness or lightheadedness. Denies snoring and has not been evaluated for sleep apnea.  The patient does not have symptoms concerning for COVID-19 infection (fever, chills, cough, or new shortness of breath).    Past Medical History:  Diagnosis Date  . Chest pain   . Hyperthyroidism   . Mood changes   . Nausea   . Shortness of breath    History reviewed. No pertinent surgical history.   Current Meds  Medication Sig  . levothyroxine (SYNTHROID) 50 MCG tablet Take 1 tablet (50 mcg total) by mouth daily before breakfast.  . nitroGLYCERIN (NITROSTAT) 0.4 MG SL tablet Place 1 tablet (0.4 mg total) under the tongue every 5 (five) minutes as needed for chest pain.  Marland Kitchen ondansetron (ZOFRAN) 4 MG tablet Take 1 tablet (4 mg total) by mouth every 8 (eight) hours as needed for nausea or vomiting.     Allergies:   Patient has no known allergies.   Social History   Tobacco Use  . Smoking status: Never Smoker  . Smokeless tobacco: Never Used  Substance Use Topics  . Alcohol use: Never    Frequency: Never  . Drug use: Never  Family Hx: The patient's family history includes Hypercholesterolemia in her father; Hypotension in her mother; Thyroid disease in her mother.  ROS:   Please see the history of present illness.     All other systems reviewed and are negative.   Prior CV studies:   The following studies were reviewed today:  Chest xray 06/08/2018  Labs/Other Tests and Data Reviewed:    EKG:  An ECG dated 06/08/2018 was personally reviewed today and demonstrated:  SR, slightly prolonged PR interval  Recent Labs: 06/08/2018: ALT 13; BUN 8; Creatinine, Ser 0.71; Hemoglobin 12.4; Platelets 275;  Potassium 3.6; Sodium 138; TSH 2.060   Recent Lipid Panel Lab Results  Component Value Date/Time   CHOL 185 06/08/2018 02:17 PM   TRIG 71 06/08/2018 02:17 PM   HDL 61 06/08/2018 02:17 PM   CHOLHDL 3.0 06/08/2018 02:17 PM   LDLCALC 110 (H) 06/08/2018 02:17 PM    Wt Readings from Last 3 Encounters:  06/08/18 145 lb (65.8 kg)     Objective:    Vital Signs:  Ht  (1.575 m)   BMI 26.52 kg/m    GEN:  no acute distress EYES:  sclerae anicteric, EOMI - Extraocular Movements Intact RESPIRATORY:  normal respiratory effort, symmetric expansion CARDIOVASCULAR:  no peripheral edema SKIN:  no rash, lesions or ulcers. MUSCULOSKELETAL:  no obvious deformities. NEURO:  alert and oriented x 3, no obvious focal deficit PSYCH:  normal affect  ASSESSMENT & PLAN:    1. Precordial pain   2. Abnormal stress test   3. Palpitations   4. Chronic midline thoracic back pain    For her sharp, stabbing, chest pain in the setting of a previously abnormal stress test in her 30s, we will obtain a CT coronary angiogram. This will allow [place] to assess coronary calcium and screen for luminal stenosis, as well as evaluate for any extracardiac structural issues contributing to pain with cross sectional imaging.   For palpitations and chest pain, we will obtain an echocardiogram to evaluate structure and function.   Back pain - I have spoken to her primary care provider about this pain. I have reiterated to the patient several times during our conversation that this is outside the scope of our cardiovascular practice and should be addressed with her PCP. PCP is aware and addressing. Our echocardiogram and CTA coronaries will screen the aorta for any contribution to chest and back pain, within the planned field of view.  COVID-19 Education: The signs and symptoms of COVID-19 were discussed with the patient and how to seek care for testing (follow up with PCP or arrange E-visit).  The importance of social  distancing was discussed today.  Time:   Today, I have spent 25 minutes with the patient with telehealth technology discussing the above problems.     Medication Adjustments/Labs and Tests Ordered: Current medicines are reviewed at length with the patient today.  Concerns regarding medicines are outlined above.   Tests Ordered: Orders Placed This Encounter  Procedures  . CT CORONARY MORPH W/CTA COR W/SCORE W/CA W/CM &/OR WO/CM  . CT CORONARY FRACTIONAL FLOW RESERVE DATA PREP  . CT CORONARY FRACTIONAL FLOW RESERVE FLUID ANALYSIS  . Basic metabolic panel  . ECHOCARDIOGRAM COMPLETE    Medication Changes: Meds ordered this encounter  Medications  . metoprolol tartrate (LOPRESSOR) 100 MG tablet    Sig: Take 1 tablet (100 mg total) by mouth once for 1 dose. 2 hours prior to CTA    Dispense:  1  tablet    Refill:  0    Disposition:  Follow up prn  Signed, Parke PoissonGayatri A Annemarie Sebree, MD  07/01/2018 11:53 AM    [facility]  Medication Instructions:  Your physician recommends that you continue on your current medications as directed. Please refer to the Current Medication list given to you today.  Take Metoprolol Tartrate 100 mg--1 tablet two hours prior to CT Angiogram  If you need a refill on your cardiac medications before your next appointment, please call your pharmacy.   Instrucciones de medicacin: Su mdico recomienda que contine con sus medicamentos actuales segn las indicaciones. Consulte la lista de Medicamentos actuales que se le dio hoy.  Tome Metoprolol Tartrate 100 mg - 1 tableta dos horas antes del angiograma CT  Si necesita reponer sus medicamentos cardacos antes de su prxima cita, llame a su farmacia.  Lab work: Your physician recommends that you return for lab work 1 week prior to CT Angiogram.  If you have labs (blood work) drawn today and your tests are completely normal, you will receive your results only by: Marland Kitchen. MyChart Message (if you  have MyChart) OR . A paper copy in the mail If you have any lab test that is abnormal or we need to change your treatment, we will call you to review the results.  Aleen Campirabajo de laboratorio: Su mdico le recomienda que regrese para Academic librarianrealizar anlisis de laboratorio 1 semana antes de la angiografa por TC.  Si le United Stationershacen anlisis de laboratorio (anlisis de [place]) hoy y sus pruebas son completamente normales, recibir sus resultados solo por: Mensaje MyChart (si tiene MyChart) Cheron Schaumann Una copia en papel por correo Si tiene alguna prueba de laboratorio que sea anormal o necesitemos cambiar su tratamiento, lo llamaremos para revisar los Broussardresultados.  Testing/Procedures:  Your physician has requested that you have an echocardiogram. Echocardiography is a painless test that uses sound waves to create images of your heart. It provides your doctor with information about the size and shape of your heart and how well your heart's chambers and valves are working. This procedure takes approximately one hour. There are no restrictions for this procedure.   Pruebas / procedimientos:  Su mdico le ha pedido que se Engineer, manufacturinghaga un ecocardiograma. La ecocardiografa es una prueba indolora que utiliza ondas sonoras para crear imgenes de su corazn. Proporciona a su mdico informacin sobre el tamao y la forma de su corazn y qu tan bien funcionan las cmaras y vlvulas de su corazn. Este procedimiento lleva aproximadamente Georgianne Fickuna hora. No hay restricciones para este procedimiento.   CORONARY CT ANGIOGRAM  [facility] at xx:xx AM (30-45 minutes prior to test start time)  [facility] [address] [place], [place] [address] [phone]  Proceed to the [facility] Radiology Department (First Floor).  Please follow these instructions carefully (unless otherwise directed):   On the Night Before the Test: . Be sure to Drink plenty of water. . Do  not consume any caffeinated/decaffeinated beverages or chocolate 12 hours prior to your test. . Do not take any antihistamines 12 hours prior to your test.  On the Day of the Test: . Drink plenty of water. Do not drink any water within one hour of the test. . Do not eat any food 4 hours prior to the test. . You may take your regular medications prior to the test.  . Take metoprolol (Lopressor) two hours prior to  test.       After the Test: . Drink plenty of water. . After receiving IV contrast, you may experience a mild flushed feeling. This is normal. . On occasion, you may experience a mild rash up to 24 hours after the test. This is not dangerous. If this occurs, you can take Benadryl 25 mg and increase your fluid intake. . If you experience trouble breathing, this can be serious. If it is severe call 911 IMMEDIATELY. If it is mild, please call our office. . If you take any of these medications: Glipizide/Metformin, Avandament, Glucavance, please do not take 48 hours after completing test.   Cheri Fowler CORONARIO CT  Llegue a la entrada principal de la [facility] a las xx: xx AM (30-45 minutos antes de la hora de inicio de la prueba)  [facility] [address] [place], [place] [address] [phone]  Proceda al Departamento de Radiologa de [place] (primer piso).  Siga estas instrucciones cuidadosamente (a menos que se indique lo contrario):   En la noche antes de la prueba: Asegrese de beber [employer]. No consuma bebidas con cafena / descafeinada o chocolate 12 horas antes de su prueba. No tome ningn antihistamnico 12 horas antes de su prueba.  El da de la prueba: Product manager abundante agua. No tome agua dentro de una hora de la prueba. No coma ningn alimento 4 horas antes de la prueba. Puede tomar sus medicamentos habituales antes de la prueba. Tome metoprolol (Lopressor) dos horas antes de la prueba.       Despus de la  prueba: Beber abundante agua. Despus de recibir contraste IV, puede experimentar una leve sensacin de enrojecimiento. Esto es normal. En ocasiones, puede experimentar una erupcin leve hasta 24 horas despus de la prueba. Esto no es peligroso. Si esto ocurre, puede tomar Benadryl 25 mg y aumentar su ingesta de lquidos. Si tiene problemas para respirar, esto puede ser grave. Si es grave, llame al 911 INMEDIATAMENTE. Si es leve, por favor llame a nuestra oficina. Si toma alguno de estos medicamentos: Glipizida / Metformina, Avandament, Glucavance, no tome 48 horas despus de completar la prueba.   Follow-Up: At [facility], you and your health needs are our priority.  As part of our continuing mission to provide you with exceptional heart care, we have created designated Provider Care Teams.  These Care Teams include your primary Cardiologist (physician) and Advanced Practice Providers (APPs -  Physician Assistants and Nurse Practitioners) who all work together to provide you with the care you need, when you need it. . We will call you with the results of your testing to discuss follow-up appointment with Dr. Jacques Navy.  Any Other Special Instructions Will Be Listed Below (If Applicable). Please contact our office at [phone] with any questions or concerns.  Seguimiento: En [employer], usted y sus necesidades de salud son Ferne Coe prioridad. Como parte de nuestra misin continua de brindarle una atencin cardaca excepcional, hemos creado equipos de atencin de proveedores designados. Estos equipos de atencin incluyen a su cardilogo primario (mdico) y proveedores de Cabin crew (APP - asistentes mdicos y profesionales de enfermera) que trabajan juntos para brindarle la atencin que necesita, cuando la necesita. Le llamaremos con los resultados de sus pruebas para Chiropractor la cita de seguimiento con el Dr. Jacques Navy.  Cualquier otra instruccin especial se enumerar a  continuacin (si corresponde). Comunquese con nuestra oficina al [phone] con cualquier pregunta o inquietud.

## 2018-07-01 NOTE — Patient Instructions (Signed)
Medication Instructions:  Your physician recommends that you continue on your current medications as directed. Please refer to the Current Medication list given to you today.  Take Metoprolol Tartrate 100 mg--1 tablet two hours prior to CT Angiogram  If you need a refill on your cardiac medications before your next appointment, please call your pharmacy.   Instrucciones de medicacin: Su mdico recomienda que contine con sus medicamentos actuales segn las indicaciones. Consulte la lista de Medicamentos actuales que se le dio hoy.  Tome Metoprolol Tartrate 100 mg - 1 tableta dos horas antes del angiograma CT  Si necesita reponer sus medicamentos cardacos antes de su prxima cita, llame a su farmacia.  Lab work: Your physician recommends that you return for lab work 1 week prior to CT Angiogram.  If you have labs (blood work) drawn today and your tests are completely normal, you will receive your results only by: Marland Kitchen MyChart Message (if you have MyChart) OR . A paper copy in the mail If you have any lab test that is abnormal or we need to change your treatment, we will call you to review the results.  Aleen Campi de laboratorio: Su mdico le recomienda que regrese para Academic librarian de laboratorio 1 semana antes de la angiografa por TC.  Si le [employer] de laboratorio (anlisis de [place]) hoy y sus pruebas son completamente normales, recibir sus resultados solo por: Mensaje MyChart (si tiene MyChart) Cheron Schaumann copia en papel por correo Si tiene alguna prueba de laboratorio que sea anormal o necesitemos cambiar su tratamiento, lo llamaremos para revisar los Leonardo.  Testing/Procedures:  Your physician has requested that you have an echocardiogram. Echocardiography is a painless test that uses sound waves to create images of your heart. It provides your doctor with information about the size and shape of your heart and how well your heart's chambers and valves are working. This  procedure takes approximately one hour. There are no restrictions for this procedure.   Pruebas / procedimientos:  Su mdico le ha pedido que se Engineer, manufacturing. La ecocardiografa es una prueba indolora que utiliza ondas sonoras para crear imgenes de su corazn. Proporciona a su mdico informacin sobre el tamao y la forma de su corazn y qu tan bien funcionan las cmaras y vlvulas de su corazn. Este procedimiento lleva aproximadamente Georgianne Fick. No hay restricciones para este procedimiento.   CORONARY CT ANGIOGRAM  Please arrive at the [facility] main entrance of [facility] at xx:xx AM (30-45 minutes prior to test start time)  [facility] [address] [place], [place] [address] [phone]  Proceed to the [facility] Radiology Department (First Floor).  Please follow these instructions carefully (unless otherwise directed):   On the Night Before the Test: . Be sure to Drink plenty of water. . Do not consume any caffeinated/decaffeinated beverages or chocolate 12 hours prior to your test. . Do not take any antihistamines 12 hours prior to your test.  On the Day of the Test: . Drink plenty of water. Do not drink any water within one hour of the test. . Do not eat any food 4 hours prior to the test. . You may take your regular medications prior to the test.  . Take metoprolol (Lopressor) two hours prior to test.       After the Test: . Drink plenty of water. . After receiving IV contrast, you may experience a mild flushed feeling. This is normal. . On occasion, you may  experience a mild rash up to 24 hours after the test. This is not dangerous. If this occurs, you can take Benadryl 25 mg and increase your fluid intake. . If you experience trouble breathing, this can be serious. If it is severe call 911 IMMEDIATELY. If it is mild, please call our office. . If you take any of these medications: Glipizide/Metformin, Avandament, Glucavance,  please do not take 48 hours after completing test.   Cheri FowlerANGIOGRAMA CORONARIO CT  Llegue a la entrada principal de la [facility] a las xx: xx AM (30-45 minutos antes de la hora de inicio de la prueba)  [facility] [address] [place], [place] [address] [phone]  Proceda al Departamento de Radiologa de [place] (primer piso).  Siga estas instrucciones cuidadosamente (a menos que se indique lo contrario):   En la noche antes de la prueba: Asegrese de beber [place]mucha agua. No consuma bebidas con cafena / descafeinada o chocolate 12 horas antes de su prueba. No tome ningn antihistamnico 12 horas antes de su prueba.  El da de la prueba: Product managerBeber abundante agua. No tome agua dentro de una hora de la prueba. No coma ningn alimento 4 horas antes de la prueba. Puede tomar sus medicamentos habituales antes de la prueba. Tome metoprolol (Lopressor) dos horas antes de la prueba.       Despus de la prueba: Beber abundante agua. Despus de recibir contraste IV, puede experimentar una leve sensacin de enrojecimiento. Esto es normal. En ocasiones, puede experimentar una erupcin leve hasta 24 horas despus de la prueba. Esto no es peligroso. Si esto ocurre, puede tomar Benadryl 25 mg y aumentar su ingesta de lquidos. Si tiene problemas para respirar, esto puede ser grave. Si es grave, llame al 911 INMEDIATAMENTE. Si es leve, por favor llame a nuestra oficina. Si toma alguno de estos medicamentos: Glipizida / Metformina, Avandament, Glucavance, no tome 48 horas despus de completar la prueba.   Follow-Up: At [facility], you and your health needs are our priority.  As part of our continuing mission to provide you with exceptional heart care, we have created designated Provider Care Teams.  These Care Teams include your primary Cardiologist (physician) and Advanced Practice Providers (APPs -  Physician Assistants and Nurse Practitioners) who all  work together to provide you with the care you need, when you need it. . We will call you with the results of your testing to discuss follow-up appointment with Dr. Jacques NavyAcharya.  Any Other Special Instructions Will Be Listed Below (If Applicable). Please contact our office at [phone] with any questions or concerns.  Seguimiento: En [facility], usted y sus necesidades de salud son Ferne Coenuestra prioridad. Como parte de nuestra misin continua de brindarle una atencin cardaca excepcional, hemos creado equipos de atencin de proveedores designados. Estos equipos de atencin incluyen a su cardilogo primario (mdico) y proveedores de Cabin crewprctica avanzada (APP - asistentes mdicos y profesionales de enfermera) que trabajan juntos para brindarle la atencin que necesita, cuando la necesita. Le llamaremos con los resultados de sus pruebas para Chiropractoranalizar la cita de seguimiento con el Dr. Jacques NavyAcharya.  Cualquier otra instruccin especial se enumerar a continuacin (si corresponde). Comunquese con nuestra oficina al [phone] con cualquier pregunta o inquietud.

## 2018-07-07 ENCOUNTER — Other Ambulatory Visit: Payer: Self-pay

## 2018-07-07 ENCOUNTER — Ambulatory Visit (HOSPITAL_COMMUNITY)
Admission: RE | Admit: 2018-07-07 | Discharge: 2018-07-07 | Disposition: A | Discharge status: Home / Self Care | Payer: Self-pay | Source: Ambulatory Visit | Attending: Internal Medicine | Admitting: Internal Medicine

## 2018-07-07 DIAGNOSIS — R9439 Abnormal result of other cardiovascular function study: Secondary | ICD-10-CM | POA: Insufficient documentation

## 2018-07-07 DIAGNOSIS — R072 Precordial pain: Secondary | ICD-10-CM | POA: Insufficient documentation

## 2018-07-07 NOTE — Progress Notes (Signed)
2D Echocardiogram has been performed.  Veronica Jensen 07/07/2018, 1:52 PM

## 2018-07-08 ENCOUNTER — Encounter: Payer: Self-pay | Admitting: *Deleted

## 2018-07-08 NOTE — Progress Notes (Signed)
Letter mailed to patient.

## 2018-07-13 ENCOUNTER — Telehealth: Payer: Self-pay

## 2018-07-13 NOTE — Telephone Encounter (Signed)
Left a vm for patient to callback to do screening for appointment

## 2018-07-14 ENCOUNTER — Ambulatory Visit: Payer: No Typology Code available for payment source | Admitting: Family Medicine

## 2018-07-14 ENCOUNTER — Encounter: Payer: Self-pay | Admitting: Family Medicine

## 2018-07-14 ENCOUNTER — Other Ambulatory Visit: Payer: Self-pay

## 2018-07-14 ENCOUNTER — Ambulatory Visit (INDEPENDENT_AMBULATORY_CARE_PROVIDER_SITE_OTHER): Payer: No Typology Code available for payment source | Admitting: Family Medicine

## 2018-07-14 VITALS — BP 110/68 | HR 75 | Temp 98.0°F | Ht 62.0 in | Wt 145.0 lb

## 2018-07-14 DIAGNOSIS — M542 Cervicalgia: Secondary | ICD-10-CM

## 2018-07-14 DIAGNOSIS — Z8742 Personal history of other diseases of the female genital tract: Secondary | ICD-10-CM

## 2018-07-14 DIAGNOSIS — R110 Nausea: Secondary | ICD-10-CM

## 2018-07-14 DIAGNOSIS — Z09 Encounter for follow-up examination after completed treatment for conditions other than malignant neoplasm: Secondary | ICD-10-CM

## 2018-07-14 DIAGNOSIS — I83813 Varicose veins of bilateral lower extremities with pain: Secondary | ICD-10-CM

## 2018-07-14 DIAGNOSIS — G8929 Other chronic pain: Secondary | ICD-10-CM

## 2018-07-14 NOTE — Progress Notes (Signed)
[facility]    Established Patient Office Visit  Subjective:  Patient ID: Veronica Jensen, female    DOB: 08/04/1975  Age: 43 y.o. MRN: [id number]  CC:  Chief Complaint  Patient presents with  . Follow-up    chronic condition   . results    echo  . Varicose Veins  . Ovarian Cyst  . Neck Pain    HPI Veronica Jensen is a 43 year old female who presents for Follow Up today.   Past Medical History:  Diagnosis Date  . Abdominal pain   . Chest pain   . Hyperthyroidism   . Mood changes   . Nausea   . Ovarian cyst   . Shortness of breath    Current Status: Since her last office visit, she is doing well with no complaints. We will use Hispanic Interpreter today for effective communication.  She is accompanied today by her husband. She states that her episodes of nausea have subsided and are almost infrequent. Denies vomiting, diarrhea, and constipation.She continues Thyroid medication as prescribed. She denies fatigue, inability to tolerate cold, unexplained weight gain, dry skin, heart palpitations, coarse hair, decrease thought process, constipation, depression. Her anxiety is mild today. She denies suicidal ideations, homicidal ideations, or auditory hallucinations.  She denies fevers, chills, fatigue, recent infections, weight loss, and night sweats. She has not had any headaches, visual changes, dizziness, and falls. No chest pain, heart palpitations, cough and shortness of breath reported.  She has no reports of blood in stools, dysuria and hematuria. She denies pain today.   History reviewed. No pertinent surgical history.  Family History  Problem Relation Age of Onset  . Hypotension Mother   . Thyroid disease Mother   . Hypercholesterolemia Father     Social History   Socioeconomic History  . Marital status: Married    Spouse name: Not on file  . Number of children: Not on file  . Years of education: Not on  file  . Highest education level: Not on file  Occupational History  . Not on file  Social Needs  . Financial resource strain: Not on file  . Food insecurity:    Worry: Not on file    Inability: Not on file  . Transportation needs:    Medical: Not on file    Non-medical: Not on file  Tobacco Use  . Smoking status: Never Smoker  . Smokeless tobacco: Never Used  Substance and Sexual Activity  . Alcohol use: Never    Frequency: Never  . Drug use: Never  . Sexual activity: Not on file  Lifestyle  . Physical activity:    Days per week: Not on file    Minutes per session: Not on file  . Stress: Not on file  Relationships  . Social connections:    Talks on phone: Not on file    Gets together: Not on file    Attends religious service: Not on file    Active member of club or organization: Not on file    Attends meetings of clubs or organizations: Not on file    Relationship status: Not on file  . Intimate partner violence:    Fear of current or ex partner: Not on file    Emotionally abused: Not on file    Physically abused: Not on file    Forced sexual activity: Not on file  Other Topics Concern  . Not on file  Social History Narrative  .  Not on file    Outpatient Medications Prior to Visit  Medication Sig Dispense Refill  . levothyroxine (SYNTHROID) 50 MCG tablet Take 1 tablet (50 mcg total) by mouth daily before breakfast. 30 tablet 3  . ondansetron (ZOFRAN) 4 MG tablet Take 1 tablet (4 mg total) by mouth every 8 (eight) hours as needed for nausea or vomiting. 20 tablet 2  . nitroGLYCERIN (NITROSTAT) 0.4 MG SL tablet Place 1 tablet (0.4 mg total) under the tongue every 5 (five) minutes as needed for chest pain. (Patient not taking: Reported on 07/14/2018) 50 tablet 3  . metoprolol tartrate (LOPRESSOR) 100 MG tablet Take 1 tablet (100 mg total) by mouth once for 1 dose. 2 hours prior to CTA 1 tablet 0   No facility-administered medications prior to visit.     No Known  Allergies  ROS Review of Systems  Constitutional: Negative.   HENT: Negative.   Eyes: Negative.   Respiratory: Negative.   Cardiovascular: Negative.   Gastrointestinal: Positive for abdominal pain (generalized. ).  Endocrine: Negative.   Genitourinary: Negative.   Musculoskeletal: Positive for arthralgias (generalized).  Skin: Negative.   Allergic/Immunologic: Negative.   Neurological: Negative.   Hematological: Negative.   Psychiatric/Behavioral: Negative.       Objective:    Physical Exam  Constitutional: She is oriented to person, place, and time. She appears well-developed and well-nourished.  Eyes: Conjunctivae are normal.  Neck: Normal range of motion. Neck supple.  Cardiovascular: Normal rate, regular rhythm, normal heart sounds and intact distal pulses.  Pulmonary/Chest: Effort normal and breath sounds normal.  Abdominal: Soft. Bowel sounds are normal.  Musculoskeletal: Normal range of motion.  Neurological: She is alert and oriented to person, place, and time. She has normal reflexes.  Skin: Skin is dry.  Psychiatric: She has a normal mood and affect. Her behavior is normal. Judgment and thought content normal.  Nursing note and vitals reviewed.   BP 110/68 (BP Location: Left Arm, Patient Position: Sitting, Cuff Size: Small)   Pulse 75   Temp 98 F (36.7 C) (Oral)   Ht 5\' 2"  (1.575 m)   Wt 145 lb (65.8 kg)   LMP 06/21/2018   SpO2 100%   BMI 26.52 kg/m  Wt Readings from Last 3 Encounters:  07/14/18 145 lb (65.8 kg)  06/08/18 145 lb (65.8 kg)     Health Maintenance Due  Topic Date Due  . HIV Screening  12/18/1989  . TETANUS/TDAP  12/18/1993  . PAP SMEAR-Modifier  12/19/1995    There are no preventive care reminders to display for this patient.  Lab Results  Component Value Date   TSH 2.060 06/08/2018   Lab Results  Component Value Date   WBC 7.8 06/08/2018   HGB 12.4 06/08/2018   HCT 38.5 06/08/2018   MCV 83.3 06/08/2018   PLT 275  06/08/2018   Lab Results  Component Value Date   NA 138 06/08/2018   K 3.6 06/08/2018   CO2 22 06/08/2018   GLUCOSE 78 06/08/2018   BUN 8 06/08/2018   CREATININE 0.71 06/08/2018   BILITOT 0.7 06/08/2018   ALKPHOS 61 06/08/2018   AST 20 06/08/2018   ALT 13 06/08/2018   PROT 6.9 06/08/2018   ALBUMIN 4.3 06/08/2018   CALCIUM 9.0 06/08/2018   ANIONGAP 10 06/08/2018   Lab Results  Component Value Date   CHOL 185 06/08/2018   Lab Results  Component Value Date   HDL 61 06/08/2018   Lab Results  Component Value  Date   LDLCALC 110 (H) 06/08/2018   Lab Results  Component Value Date   TRIG 71 06/08/2018   Lab Results  Component Value Date   CHOLHDL 3.0 06/08/2018   Lab Results  Component Value Date   HGBA1C 5.5 06/08/2018     Assessment & Plan:   1. History of ovarian cyst, unspecified laterality - Ambulatory referral to Obstetrics / Gynecology  2. Nausea Stable today.   3. Varicose veins of bilateral lower extremities with pain Stable today. We will send referral to Vascular Surgeon, if needed, for chronic leg pain r/t Varicose Veins.  4. Chronic neck pain Stable today. We will send referral for Neck Pain at next office visit if needed.   5. Follow up She will follow up in 3 months.   No orders of the defined types were placed in this encounter.   Orders Placed This Encounter  Procedures  . Ambulatory referral to Obstetrics / Gynecology     Referral Orders     Ambulatory referral to Obstetrics / Gynecology   Raliegh IpNatalie Treniya Lobb,  MSN, FNP-BC [facility] [address]  [place], [place] [address] [phone]  Problem List Items Addressed This Visit      Other   Nausea    Other Visit Diagnoses    Cyst of ovary, unspecified laterality    -  Primary   Relevant Orders   Ambulatory referral to Obstetrics / Gynecology   Varicose veins of bilateral lower extremities with pain       Chronic neck pain       Follow  up          No orders of the defined types were placed in this encounter.   Follow-up: Return in about 3 months (around 10/14/2018).    Kallie LocksNatalie M Sabrine Patchen, FNP

## 2018-07-15 ENCOUNTER — Telehealth: Payer: Self-pay | Admitting: Internal Medicine

## 2018-07-15 ENCOUNTER — Encounter: Payer: Self-pay | Admitting: Family Medicine

## 2018-07-15 DIAGNOSIS — G8929 Other chronic pain: Secondary | ICD-10-CM | POA: Insufficient documentation

## 2018-07-15 DIAGNOSIS — I83813 Varicose veins of bilateral lower extremities with pain: Secondary | ICD-10-CM | POA: Insufficient documentation

## 2018-07-15 DIAGNOSIS — M542 Cervicalgia: Secondary | ICD-10-CM | POA: Insufficient documentation

## 2018-07-15 DIAGNOSIS — Z8742 Personal history of other diseases of the female genital tract: Secondary | ICD-10-CM | POA: Insufficient documentation

## 2018-07-15 NOTE — Telephone Encounter (Signed)
New Message    Pt is calling to schedule CT   Please call

## 2018-07-15 NOTE — Telephone Encounter (Signed)
Returned call to patient she was calling to schedule coronary ct.Advised order has been placed.Advised she will receive a call from a scheduler with appointment.Advised the process takes a few weeks.

## 2018-07-21 ENCOUNTER — Encounter (HOSPITAL_COMMUNITY): Payer: Self-pay

## 2018-07-27 ENCOUNTER — Encounter: Payer: Self-pay | Admitting: *Deleted

## 2018-07-27 ENCOUNTER — Encounter (HOSPITAL_COMMUNITY): Payer: Self-pay

## 2018-07-27 ENCOUNTER — Other Ambulatory Visit: Payer: Self-pay

## 2018-07-27 ENCOUNTER — Emergency Department (HOSPITAL_COMMUNITY)
Admission: EM | Admit: 2018-07-27 | Discharge: 2018-07-27 | Disposition: A | Discharge status: Home / Self Care | Payer: HRSA Program | Attending: Emergency Medicine | Admitting: Emergency Medicine

## 2018-07-27 DIAGNOSIS — R070 Pain in throat: Secondary | ICD-10-CM | POA: Diagnosis present

## 2018-07-27 DIAGNOSIS — R197 Diarrhea, unspecified: Secondary | ICD-10-CM | POA: Diagnosis not present

## 2018-07-27 DIAGNOSIS — Z79899 Other long term (current) drug therapy: Secondary | ICD-10-CM | POA: Diagnosis not present

## 2018-07-27 DIAGNOSIS — Z20828 Contact with and (suspected) exposure to other viral communicable diseases: Secondary | ICD-10-CM | POA: Diagnosis not present

## 2018-07-27 DIAGNOSIS — E039 Hypothyroidism, unspecified: Secondary | ICD-10-CM | POA: Diagnosis not present

## 2018-07-27 DIAGNOSIS — Z20822 Contact with and (suspected) exposure to covid-19: Secondary | ICD-10-CM

## 2018-07-27 DIAGNOSIS — J029 Acute pharyngitis, unspecified: Secondary | ICD-10-CM

## 2018-07-27 DIAGNOSIS — R110 Nausea: Secondary | ICD-10-CM | POA: Diagnosis not present

## 2018-07-27 LAB — GROUP A STREP BY PCR: Group A Strep by PCR: NOT DETECTED

## 2018-07-27 NOTE — ED Notes (Signed)
Patient verbalizes understanding of discharge instructions. Opportunity for questioning and answers were provided. Armband removed by staff, pt discharged from ED.  

## 2018-07-27 NOTE — ED Provider Notes (Signed)
[facility] Note   CSN: [id number] Arrival date & time: 07/27/18  1843    History   Chief Complaint Chief Complaint  Patient presents with  . Sore Throat    HPI Veronica Jensen is a 43 y.o. female presenting for evaluation of sore throat.  Patient states that the past 2 days, she has been having a sore throat.  She also reports associated nausea and diarrhea.  She denies fevers, chills, cough, chest pain, or abdominal pain.  Patient states she does not feel short of breath, however when she takes a deep breath and she does not feel like she is getting good air movement, and thus feels like she needs to continue to take deep breaths.  She has not taken anything for her symptoms.  She has no other medical problems and takes no medications daily.  She denies tobacco, alcohol, drug use.  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  She states her husband has developed a cough in the past day.  No one else around her is sick.     HPI  Past Medical History:  Diagnosis Date  . Abdominal pain   . Chest pain   . Chronic neck pain   . Hyperthyroidism   . Mood changes   . Nausea   . Ovarian cyst   . Shortness of breath   . Varicose veins of both lower extremities     Patient Active Problem List   Diagnosis Date Noted  . History of ovarian cyst 07/15/2018  . Varicose veins of bilateral lower extremities with pain 07/15/2018  . Chronic neck pain 07/15/2018  . Hypothyroidism 06/08/2018  . Chest pain 06/08/2018  . Shortness of breath 06/08/2018  . Nausea 06/08/2018    Past Surgical History:  Procedure Laterality Date  . MYOMECTOMY       OB History   No obstetric history on file.      Home Medications    Prior to Admission medications   Medication Sig Start Date End Date Taking? Authorizing Provider  levothyroxine (SYNTHROID) 50 MCG tablet Take 1 tablet  (50 mcg total) by mouth daily before breakfast. 06/14/18   Kallie LocksStroud, Natalie M, FNP  nitroGLYCERIN (NITROSTAT) 0.4 MG SL tablet Place 1 tablet (0.4 mg total) under the tongue every 5 (five) minutes as needed for chest pain. Patient not taking: Reported on 07/14/2018 06/08/18   Kallie LocksStroud, Natalie M, FNP  ondansetron (ZOFRAN) 4 MG tablet Take 1 tablet (4 mg total) by mouth every 8 (eight) hours as needed for nausea or vomiting. 06/08/18   Kallie LocksStroud, Natalie M, FNP    Family History Family History  Problem Relation Age of Onset  . Hypotension Mother   . Thyroid disease Mother   . Hypercholesterolemia Father     Social History Social History   Tobacco Use  . Smoking status: Never Smoker  . Smokeless tobacco: Never Used  Substance Use Topics  . Alcohol use: Never    Frequency: Never  . Drug use: Never     Allergies   Patient has no known allergies.   Review of Systems Review of Systems  Constitutional: Negative for fever.  HENT: Positive for sore throat.   Respiratory: Negative for cough.   Cardiovascular: Negative for chest pain.  Gastrointestinal: Positive for diarrhea and nausea.     Physical Exam Updated Vital Signs BP 94/69   Pulse 63  Temp 98.3 F (36.8 C) (Oral)   Resp 16   Ht 5\' 2"  (1.575 m)   Wt 65.8 kg   SpO2 100%   BMI 26.52 kg/m   Physical Exam Vitals signs and nursing note reviewed.  Constitutional:      General: She is not in acute distress.    Appearance: She is well-developed.     Comments: Appears nontoxic  HENT:     Head: Normocephalic and atraumatic.     Comments: OP mildly erythematous without obvious tonsillar swelling or exudate.  Uvula midline with equal palate rise.    Right Ear: Tympanic membrane, ear canal and external ear normal.     Left Ear: Tympanic membrane, ear canal and external ear normal.     Nose: Mucosal edema present.     Right Sinus: No maxillary sinus tenderness or frontal sinus tenderness.     Left Sinus: No maxillary sinus  tenderness or frontal sinus tenderness.     Mouth/Throat:     Lips: Pink.     Mouth: Mucous membranes are moist.     Pharynx: Uvula midline. Posterior oropharyngeal erythema present. No oropharyngeal exudate or uvula swelling.     Tonsils: No tonsillar exudate. 0 on the right. 0 on the left.  Eyes:     Conjunctiva/sclera: Conjunctivae normal.     Pupils: Pupils are equal, round, and reactive to light.  Neck:     Musculoskeletal: Normal range of motion.  Cardiovascular:     Rate and Rhythm: Normal rate and regular rhythm.     Pulses: Normal pulses.  Pulmonary:     Effort: Pulmonary effort is normal.     Breath sounds: Normal breath sounds. No decreased breath sounds, wheezing, rhonchi or rales.     Comments: Speaking in full sentences.  Clear lung sounds in all fields.  No respiratory distress. Abdominal:     General: There is no distension.     Palpations: Abdomen is soft. There is no mass.     Tenderness: There is no abdominal tenderness. There is no guarding or rebound.  Musculoskeletal: Normal range of motion.  Lymphadenopathy:     Cervical: No cervical adenopathy.  Skin:    General: Skin is warm.     Capillary Refill: Capillary refill takes less than 2 seconds.  Neurological:     Mental Status: She is alert and oriented to person, place, and time.      ED Treatments / Results  Labs (all labs ordered are listed, but only abnormal results are displayed) Labs Reviewed  GROUP A STREP BY PCR  NOVEL CORONAVIRUS, NAA (HOSPITAL ORDER, SEND-OUT TO REF LAB)    EKG None  Radiology No results found.  Procedures Procedures (including critical care time)  Medications Ordered in ED Medications - No data to display   Initial Impression / Assessment and Plan / ED Course  I have reviewed the triage vital signs and the nursing notes.  Pertinent labs & imaging results that were available during my care of the patient were reviewed by me and considered in my medical decision  making (see chart for details).        Pt presenting for evaluation of 2 days history of sore throat, nausea, diarrhea.  Physical examination gastritis nontoxic.  OP exam is not consistent with strep, however as she is having sore throat, nausea, diarrhea, will test for strep.  As patient has had multiple contacts with positive COVID, will also test for coronavirus.  Respiratory status is  reassuring, pulse ox 100%.  Rather this is coronavirus would not, I do not believe she needs to be hospitalized.  Discussed that results will return in 24 to 48 hours.  Discussed quarantine until results have returned.  Strep negative.  As such, will encourage patient to treat symptomatically and monitor for worsening signs of respiratory status.  At this time, patient appears safe for discharge.  Return precautions given.  Patient states she understands agrees plan.  Jolanta Cabeza was evaluated in Emergency Department on 07/27/2018 for the symptoms described in the history of present illness. She was evaluated in the context of the global COVID-19 pandemic, which necessitated consideration that the patient might be at risk for infection with the SARS-CoV-2 virus that causes COVID-19. Institutional protocols and algorithms that pertain to the evaluation of patients at risk for COVID-19 are in a state of rapid change based on information released by regulatory bodies including the CDC and federal and state organizations. These policies and algorithms were followed during the patient's care in the ED.   Final Clinical Impressions(s) / ED Diagnoses   Final diagnoses:  Pharyngitis, unspecified etiology  Close Exposure to Covid-19 Virus    ED Discharge Orders    None       Franchot Heidelberg, PA-C 07/27/18 2059    Dorie Rank, MD 07/29/18 [phone]

## 2018-07-27 NOTE — Discharge Instructions (Signed)
ou were tested for coronavirus today, results will come back in 24-48 hours.  If positive, you should receive a phone call.  If negative, you will not.  Either way, you may check online on MyChart. Return to the emergency room if you develop difficulty breathing any new, worsening, concerning symptoms.  Ha recibido Visual merchandiser de coronavirus hoy. Los resultos regresaran en 24-48 horas. Si es positivo, debe recibir [employer]. Si es negativo, no recibe Product manager. Puede ver los resultos en la computadora.  Regresa a la sala de emergencia si tiene dificultad con respiracion o con nuevas sintomas.

## 2018-07-27 NOTE — ED Triage Notes (Addendum)
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

## 2018-07-29 LAB — NOVEL CORONAVIRUS, NAA (HOSP ORDER, SEND-OUT TO REF LAB; TAT 18-24 HRS): SARS-CoV-2, NAA: NOT DETECTED

## 2018-08-10 ENCOUNTER — Other Ambulatory Visit: Payer: Self-pay

## 2018-08-10 ENCOUNTER — Ambulatory Visit: Payer: Self-pay | Attending: Family Medicine

## 2018-08-13 ENCOUNTER — Encounter: Payer: No Typology Code available for payment source | Admitting: Obstetrics & Gynecology

## 2018-08-16 ENCOUNTER — Other Ambulatory Visit: Payer: Self-pay

## 2018-08-16 ENCOUNTER — Ambulatory Visit (INDEPENDENT_AMBULATORY_CARE_PROVIDER_SITE_OTHER): Payer: Self-pay | Admitting: Obstetrics and Gynecology

## 2018-08-16 ENCOUNTER — Encounter: Payer: Self-pay | Admitting: Obstetrics and Gynecology

## 2018-08-16 DIAGNOSIS — Z8742 Personal history of other diseases of the female genital tract: Secondary | ICD-10-CM

## 2018-08-16 DIAGNOSIS — Z1231 Encounter for screening mammogram for malignant neoplasm of breast: Secondary | ICD-10-CM

## 2018-08-16 NOTE — Progress Notes (Signed)
   TELEHEALTH VIRTUAL GYNECOLOGY VISIT ENCOUNTER NOTE  I connected with Drucie Ip on 08/16/18 at  3:15 PM EDT by telephone at home and verified that I am speaking with the correct person using two identifiers.   I discussed the limitations, risks, security and privacy concerns of performing an evaluation and management service by telephone and the availability of in person appointments. I also discussed with the patient that there may be a patient responsible charge related to this service. The patient expressed understanding and agreed to proceed.   History:  Veronica Jensen is a 43 y.o. No obstetric history on file. female being evaluated today for history of ovarian cyst. Last noted 1 yr ago in [place]. She also reports some dysmenorrhea 1-2 days prior to her cycles.Cycles are regular. Last 2-3 days. No contraception. Sexual active. H/O myomectomy 18 yrs ago. Denies bowel or bladder dysfunction.      Past Medical History:  Diagnosis Date  . Abdominal pain   . Chest pain   . Chronic neck pain   . Hyperthyroidism   . Mood changes   . Nausea   . Ovarian cyst   . Shortness of breath   . Varicose veins of both lower extremities    Past Surgical History:  Procedure Laterality Date  . MYOMECTOMY     The following portions of the patient's history were reviewed and updated as appropriate: allergies, current medications, past family history, past medical history, past social history, past surgical history and problem list.   Health Maintenance:  Normal pap and negative HRHPV on 2019.  Marland Kitchen   Review of Systems:  Pertinent items noted in HPI and remainder of comprehensive ROS otherwise negative.  Physical Exam:   General:  Alert, oriented and cooperative.   Mental Status: Normal mood and affect perceived. Normal judgment and thought content.  Physical exam deferred due to nature of the encounter  Labs and Imaging No results found for this or any previous visit (from the past  336 hour(s)). No results found.    Assessment and Plan:     1. History of ovarian cyst  - US PELVIC COMPLETE WITH TRANSVAGINAL; Future  Screening mammogram ordered as well      I discussed the assessment and treatment plan with the patient. The patient was provided an opportunity to ask questions and all were answered. The patient agreed with the plan and demonstrated an understanding of the instructions.   The patient was advised to call back or seek an in-person evaluation/go to the ED if the symptoms worsen or if the condition fails to improve as anticipated.  I provided 10 minutes of non-face-to-face time during this encounter.   Chancy Milroy, MD [facility] [place] [place]

## 2018-08-24 ENCOUNTER — Ambulatory Visit (HOSPITAL_COMMUNITY): Payer: Self-pay

## 2018-08-26 ENCOUNTER — Other Ambulatory Visit: Payer: Self-pay

## 2018-08-26 ENCOUNTER — Ambulatory Visit (HOSPITAL_COMMUNITY)
Admission: RE | Admit: 2018-08-26 | Discharge: 2018-08-26 | Disposition: A | Discharge status: Home / Self Care | Payer: Self-pay | Source: Ambulatory Visit | Attending: Internal Medicine | Admitting: Internal Medicine

## 2018-08-26 ENCOUNTER — Ambulatory Visit (HOSPITAL_COMMUNITY): Payer: Self-pay

## 2018-08-26 DIAGNOSIS — R9439 Abnormal result of other cardiovascular function study: Secondary | ICD-10-CM | POA: Insufficient documentation

## 2018-08-26 DIAGNOSIS — R072 Precordial pain: Secondary | ICD-10-CM | POA: Insufficient documentation

## 2018-08-26 MED ORDER — IOHEXOL 350 MG/ML SOLN
100.0000 mL | Freq: Once | INTRAVENOUS | Status: AC | PRN
  Administered 2018-08-26: 100 mL via INTRAVENOUS

## 2018-08-26 MED ORDER — NITROGLYCERIN 0.4 MG SL SUBL
0.8000 mg | SUBLINGUAL_TABLET | Freq: Once | SUBLINGUAL | Status: AC
  Administered 2018-08-26: 15:00:00 0.8 mg via SUBLINGUAL

## 2018-08-26 MED ORDER — NITROGLYCERIN 0.4 MG SL SUBL
SUBLINGUAL_TABLET | SUBLINGUAL | Status: AC
  Filled 2018-08-26: qty 2

## 2018-08-26 NOTE — Progress Notes (Signed)
Ct complete. Patient reports of headache, denies any other complaints. Offered patient snack and beverage.

## 2018-08-30 ENCOUNTER — Ambulatory Visit (HOSPITAL_COMMUNITY)
Admission: RE | Admit: 2018-08-30 | Discharge: 2018-08-30 | Disposition: A | Discharge status: Home / Self Care | Payer: Self-pay | Source: Ambulatory Visit | Attending: Obstetrics and Gynecology | Admitting: Obstetrics and Gynecology

## 2018-08-30 ENCOUNTER — Other Ambulatory Visit: Payer: Self-pay

## 2018-08-30 DIAGNOSIS — Z8742 Personal history of other diseases of the female genital tract: Secondary | ICD-10-CM | POA: Insufficient documentation

## 2018-09-06 ENCOUNTER — Other Ambulatory Visit (HOSPITAL_COMMUNITY): Payer: Self-pay | Admitting: *Deleted

## 2018-09-06 ENCOUNTER — Telehealth (INDEPENDENT_AMBULATORY_CARE_PROVIDER_SITE_OTHER): Payer: Self-pay

## 2018-09-06 DIAGNOSIS — Z1231 Encounter for screening mammogram for malignant neoplasm of breast: Secondary | ICD-10-CM

## 2018-09-06 DIAGNOSIS — Z8742 Personal history of other diseases of the female genital tract: Secondary | ICD-10-CM

## 2018-09-06 NOTE — Telephone Encounter (Addendum)
-----   Message from Chancy Milroy, MD sent at 09/06/2018 10:48 AM EDT ----- U/S showed a small 2.5 cm ovarian cyst. Normal finding. Order repeat U/S in 6-8 weeks to verify resolution. Thanks [employer] pt with Britta Mccreedy., and gave pt results and [place] appt scheduled Sept 3rd @ 1500.  Pt verbalized understanding.

## 2018-09-16 ENCOUNTER — Telehealth: Payer: Self-pay | Admitting: *Deleted

## 2018-09-16 NOTE — Telephone Encounter (Signed)
Used spanish interpreter [place] [phone] Spoke to patient result given and f/u appt set up for Sep 22 2018 a 9:20  Am - 40 min slot   will need the aid of an interpreter/  patient will have vitals

## 2018-09-16 NOTE — Telephone Encounter (Signed)
-----   Message from Elouise Munroe, MD sent at 09/13/2018 12:17 PM EDT ----- Suboptimal quality study. No proximal CAD or calcium noted.  If concern for ischemia persists, we should perform alternate imaging to confirm. Please make her a follow up with me or APP to review further.

## 2018-09-22 ENCOUNTER — Telehealth: Payer: Self-pay

## 2018-09-22 ENCOUNTER — Telehealth: Payer: Self-pay | Admitting: Internal Medicine

## 2018-09-22 NOTE — Telephone Encounter (Signed)
Attempted to call pt x 2 via interpreter to proceed with virtual appointment. Left message to call back and reschedule.

## 2018-09-24 ENCOUNTER — Ambulatory Visit: Payer: Self-pay | Admitting: Obstetrics and Gynecology

## 2018-10-08 NOTE — Progress Notes (Signed)
Virtual Visit via Telephone Note   This visit type was conducted due to national recommendations for restrictions regarding the COVID-19 Pandemic (e.g. social distancing) in an effort to limit this patient's exposure and mitigate transmission in our community.  Due to her co-morbid illnesses, this patient is at least at moderate risk for complications without adequate follow up.  This format is felt to be most appropriate for this patient at this time.  The patient did not have access to video technology/had technical difficulties with video requiring transitioning to audio format only (telephone).  All issues noted in this document were discussed and addressed.  No physical exam could be performed with this format.  Please refer to the patient's chart for her  consent to telehealth for [facility].   Date:  10/11/2018   ID:  Veronica Jensen, DOB 11/24/1975, MRN [id number]  Patient Location: Home Provider Location: Home  PCP:  Kallie LocksStroud, Natalie M, FNP  Cardiologist:  Parke PoissonGayatri A Acharya, MD  Electrophysiologist:  None   Evaluation Performed:  Follow-Up Visit  Chief Complaint:  Follow-up recent cardiac work-up  History of Present Illness:    Veronica Jensen is a 43 y.o. female with a PMH of hypothyroidism and atypical chest pain, who presents today for follow-up of chest pain. Interpreter, [place], [place] #[id number] assisted with interpretation.   Patient was last evaluated by cardiology via a telemedicine visit with Dr. Jacques NavyAcharya 07/01/2018, at which time she was seen to establish care and evaluate chest pain. She reported an abnormal stress test ~10 years ago. She was recommended for a coronary CTA to further evaluate her symptoms. She underwent a coronary CTA 08/26/2018 which revealed a calcium score of 0 and normal coronary arteries, though study was felt to be limited due to patient movement. Echo 06/2018 with EF >65%, normal LV diastolic function, and no significant valvular abnormalities.    She returns today via a telemedicine visit for follow-up of her chest pain. She reports no recurrence of her sharp stabbing chest pain since her last visit. We discussed her echocardiogram and coronary CTA results in detail. No other cardiac complaints at this time. We discussed risk factors for CAD going forward, including good blood pressure, glucose, and cholesterol control.   The patient does not have symptoms concerning for COVID-19 infection (fever, chills, cough, or new shortness of breath).    Past Medical History:  Diagnosis Date  . Abdominal pain   . Chest pain   . Chronic neck pain   . Hyperthyroidism   . Mood changes   . Nausea   . Ovarian cyst   . Shortness of breath   . Varicose veins of both lower extremities    Past Surgical History:  Procedure Laterality Date  . MYOMECTOMY       Current Meds  Medication Sig  . levothyroxine (SYNTHROID) 50 MCG tablet Take 1 tablet (50 mcg total) by mouth daily before breakfast.     Allergies:   Patient has no known allergies.   Social History   Tobacco Use  . Smoking status: Never Smoker  . Smokeless tobacco: Never Used  Substance Use Topics  . Alcohol use: Never    Frequency: Never  . Drug use: Never     Family Hx: The patient's family history includes Hypercholesterolemia in her father; Hypotension in her mother; Thyroid disease in her mother.  ROS:   Please see the history of present illness.     All other systems reviewed and are negative.  Prior CV studies:   The following studies were reviewed today:  Coronary CTA 08/26/2018: IMPRESSION: 1. Study is significantly impacted by motion artifact. No definite proximal CAD, CADRADS = 0. Mid and distal vessels not well assessed.  2. Coronary calcium score of 0. This was 0 percentile for age and sex matched control.  3. Normal coronary origin with right dominance.  Echocardiogram 06/2018: IMPRESSIONS    1. The left ventricle has hyperdynamic systolic  function, with an ejection fraction of >65%. The cavity size was normal. Left ventricular diastolic parameters were normal.  2. The right ventricle has normal systolic function. The cavity was normal. There is no increase in right ventricular wall thickness.  3. No evidence of mitral valve stenosis.  4. No stenosis of the aortic valve.  5. The aortic root and ascending aorta are normal in size and structure.  6. The interatrial septum was not assessed.  Labs/Other Tests and Data Reviewed:    EKG:  An ECG dated 06/08/2018 was personally reviewed today and demonstrated:  sinus rhythm with 1st degree AV block, rate 63 bpm, QTc 417, no STE/D, no TWI  Recent Labs: 06/08/2018: ALT 13; BUN 8; Creatinine, Ser 0.71; Hemoglobin 12.4; Platelets 275; Potassium 3.6; Sodium 138; TSH 2.060   Recent Lipid Panel Lab Results  Component Value Date/Time   CHOL 185 06/08/2018 02:17 PM   TRIG 71 06/08/2018 02:17 PM   HDL 61 06/08/2018 02:17 PM   CHOLHDL 3.0 06/08/2018 02:17 PM   LDLCALC 110 (H) 06/08/2018 02:17 PM    Wt Readings from Last 3 Encounters:  10/11/18 145 lb (65.8 kg)  07/27/18 145 lb (65.8 kg)  07/14/18 145 lb (65.8 kg)     Objective:    Vital Signs:  BP 102/66   Pulse 62   Ht 5\' 2"  (1.575 m)   Wt 145 lb (65.8 kg)   BMI 26.52 kg/m    VITAL SIGNS:  reviewed GEN:  no acute distress RESPIRATORY:  speaking in full sentences without SOB CARDIOVASCULAR:  no LE edema per patient PSYCH:  A&O x3  ASSESSMENT & PLAN:    1. Atypical chest pain: No recurrent chest pain since her last visit in May. Recent coronary CTA without CAD. Echo 06/2018 with normal LV function and no valvular abnormalities.  - Continue to encourage dietary and lifestyle modifications to prevent development of CAD in the future. - Goal BP <130/80; LDL <100; A1C <7 - At this point her only outlier is her cholesterol with LDL 110 which is near goal. Will provide information for lifestyle/dietary modifications to lower  cholesterol. She should have fasting lipids re-checked in 6 months by her PCP and if her LDL remains >100, would consider starting a statin.   COVID-19 Education: The signs and symptoms of COVID-19 were discussed with the patient and how to seek care for testing (follow up with PCP or arrange E-visit).  The importance of social distancing was discussed today.  Time:   Today, I have spent 12 minutes with the patient with telehealth technology discussing the above problems.     Medication Adjustments/Labs and Tests Ordered: Current medicines are reviewed at length with the patient today.  Concerns regarding medicines are outlined above.   Tests Ordered:None No orders of the defined types were placed in this encounter.   Medication Changes: None No orders of the defined types were placed in this encounter.   Follow Up:  Virtual Visit or In Person prn  Signed, Beatriz StallionKrista M. Kroeger, PA-C  10/11/2018 8:45 AM    [facility]

## 2018-10-11 ENCOUNTER — Other Ambulatory Visit: Payer: Self-pay

## 2018-10-11 ENCOUNTER — Encounter: Payer: Self-pay | Admitting: Medical

## 2018-10-11 ENCOUNTER — Telehealth (INDEPENDENT_AMBULATORY_CARE_PROVIDER_SITE_OTHER): Payer: Self-pay | Admitting: Medical

## 2018-10-11 VITALS — BP 102/66 | HR 62 | Ht 62.0 in | Wt 145.0 lb

## 2018-10-11 DIAGNOSIS — R0789 Other chest pain: Secondary | ICD-10-CM

## 2018-10-11 NOTE — Patient Instructions (Addendum)
Medication Instructions:  Your physician recommends that you continue on your current medications as directed. Please refer to the Current Medication list given to you today.  Instrucciones de medicacin: Su mdico le recomienda que contine con sus medicamentos actuales segn las indicaciones. Consulte la lista de medicamentos actuales que recibi hoy.  Follow-Up: . Judy PimpleKrista Kroeger, PA has recommended to follow-up as needed.  Seguimiento: Judy PimpleKrista Kroeger, PA ha recomendado Education officer, environmentalrealizar un seguimiento segn sea necesario.   Any Other Special Instructions Will Be Listed Below (If Applicable). Cualquier otra instruccin especial se enumerar a continuacin (si corresponde).  Prevencin del colesterol alto Preventing High Cholesterol El colesterol es una sustancia cerosa parecida a la grasa que el organismo necesita en pequeas cantidades. El hgado fabrica todo el colesterol que el cuerpo necesita. Tener el colesterol alto (hipercolesterolemia) aumenta el riesgo de sufrir enfermedades cardacas y accidentes cerebrovasculares. El colesterol extra (exceso de colesterol) proviene de los alimentos que come, por ejemplo grasas de origen animal (grasas saturadas) de la carne y de algunos productos lcteos. El colesterol alto con frecuencia puede prevenirse con cambios en la dieta y en el estilo de vida. Si ya tiene Engineer, productioncolesterol alto, puede controlarlo haciendo cambios en la dieta y en el estilo de vida, adems de con medicamentos. Qu cambios en la alimentacin se pueden hacer?  Coma menos grasas saturadas. Los alimentos que contienen grasas saturadas incluyen las carnes rojas y algunos productos lcteos.  Evite las carnes procesadas, como el tocino, los fiambres y embutidos.  Evite las grasas trans, que se encuentran en la margarina y en algunos productos horneados.  Evite alimentos y bebidas que tengan azcares agregados.  Consuma ms frutas, verduras y cereales integrales.  Elija fuentes  saludables de protenas, como el pescado, la carne de ave y los frutos secos.  Elija fuentes saludables de grasas, por ejemplo: ? Frutos secos. ? Aceites vegetales, en particular el aceite de oliva. ? Pescados que contengan grasas saludables (cidos grasos omega-3), como la caballa o el salmn. Qu cambios en el estilo de vida se pueden realizar?   Baje de peso si es necesario. Bajar entre 5 y 10lb (2,3 a 4,5kg) puede ayudar a prevenir o Public house managercontrolar el colesterol alto y a Software engineerreducir el riesgo de padecer diabetes y presin arterial alta (hipertensin). Pdale al mdico que le recomiende una dieta y un plan de ejercicios para bajar de peso de forma segura.  Ejerctese lo suficiente. Debe realizar al menos 150minutos de ejercicios de intensidad moderada todas las semanas. ? Theatre stage manageruede realizar este tiempo en sesiones cortas de ejercicios, varias veces al da, o puede realizar sesiones ms largas, pero menos veces por semana. Por ejemplo, puede realizar una caminata enrgica o andar en bicicleta durante 10minutos, 3veces al da, durante 5das a la semana.  No fume. Si necesita ayuda para dejar de fumar, consulte al mdico.  Limite el consumo de bebidas alcohlicas. Si bebe alcohol, limite el consumo a no ms de 1medida por da si es mujer y no est [place]barazada, y 2medidas por da si es hombre. Una medida equivale a 12onzas de cerveza, 5onzas de vino o 1onzas de bebidas alcohlicas de alta graduacin. Por qu son importantes estos cambios?  Si tiene Engineer, productioncolesterol alto, se pueden acumular depsitos de esta sustancia (placa) en las paredes de los vasos sanguneos. La placa hace que las arterias se vuelvan ms estrechas y rgidas, lo que puede limitar u obstruir la circulacin sangunea y Development worker, international aidprovocar la formacin de cogulos de [place]angre. Esto aumenta en gran medida el riesgo  de infarto de miocardio y de accidente cerebrovascular. Hacer cambios en la dieta y en el estilo de vida puede ayudar a reducir el riesgo  de sufrir estas afecciones potencialmente mortales. Qu puedo hacer para reducir mis riesgos?  Controle los factores de riesgo del colesterol alto. Hable con el mdico acerca de todos los factores de riesgo y cmo reducir Catering manager.  Controle otras afecciones que pueda tener, por ejemplo diabetes o presin arterial alta (hipertensin).  Contrlese el colesterol a intervalos regulares.  Concurra a todas las visitas de control como se lo haya indicado el mdico. Esto es importante. Cmo se trata? Adems de los cambios en la dieta y en el estilo de vida, el mdico puede recomendarle que tome ciertos medicamentos para reducir el colesterol, por ejemplo, medicamentos que reducen la cantidad de colesterol producida por el hgado. Es posible que necesite tomar medicamentos si:  No logra reducir lo suficiente el colesterol con cambios en la dieta y en el estilo de vida.  Tiene colesterol alto y presenta otros factores de riesgo de sufrir enfermedades cardacas o accidentes cerebrovasculares. Tome los medicamentos de venta libre y los recetados solamente como se lo haya indicado el mdico. Dnde encontrar ms informacin  Asociacin Estadounidense de Catering manager (Musician, Medical illustrator): [email or web address]  [place], Education officer, museum y Music therapist (Water engineer, Lung, and [place]): [email or web address] Resumen  El colesterol alto aumenta el riesgo de sufrir enfermedades cardacas y accidentes cerebrovasculares. Si mantiene el colesterol bajo, puede reducir el riesgo de tener estas afecciones.  Los [employer] dieta y en el estilo de vida son los pasos ms importantes para prevenir Advertising account planner.  Consulte al mdico para controlar los factores de riesgo y hgase anlisis de sangre con regularidad. Esta informacin no tiene Buyer, retail el consejo del mdico. Asegrese de hacerle al mdico cualquier pregunta que tenga. Document Released: 02/18/2015 Document Revised: 05/14/2016 Document Reviewed: 02/18/2015 Elsevier Patient Education  2020 [employer].

## 2018-10-15 ENCOUNTER — Ambulatory Visit: Payer: No Typology Code available for payment source | Admitting: Family Medicine

## 2018-10-21 ENCOUNTER — Ambulatory Visit (HOSPITAL_COMMUNITY): Payer: Self-pay

## 2018-10-21 ENCOUNTER — Ambulatory Visit (HOSPITAL_COMMUNITY)
Admission: RE | Admit: 2018-10-21 | Discharge: 2018-10-21 | Disposition: A | Discharge status: Home / Self Care | Payer: Self-pay | Source: Ambulatory Visit | Attending: Obstetrics and Gynecology | Admitting: Obstetrics and Gynecology

## 2018-10-21 ENCOUNTER — Other Ambulatory Visit: Payer: Self-pay

## 2018-10-21 DIAGNOSIS — Z8742 Personal history of other diseases of the female genital tract: Secondary | ICD-10-CM | POA: Insufficient documentation

## 2018-11-03 ENCOUNTER — Other Ambulatory Visit: Payer: Self-pay | Admitting: Family Medicine

## 2018-11-03 DIAGNOSIS — E039 Hypothyroidism, unspecified: Secondary | ICD-10-CM

## 2018-11-04 ENCOUNTER — Telehealth: Payer: Self-pay | Admitting: Lactation Services

## 2018-11-04 NOTE — Telephone Encounter (Signed)
Called pt with assistance of [place] to given [place] results. Pt did not answer, LM for pt to call the office for her results.

## 2018-11-04 NOTE — Telephone Encounter (Signed)
-----   Message from Chancy Milroy, MD sent at 11/01/2018  1:24 PM EDT ----- Please let pt know that her GYN U/S was normal. Thanks Legrand Como

## 2018-11-10 ENCOUNTER — Ambulatory Visit: Payer: Self-pay | Admitting: Family Medicine

## 2018-11-11 ENCOUNTER — Telehealth: Payer: Self-pay | Admitting: Family Medicine

## 2018-11-11 NOTE — Telephone Encounter (Signed)
I call Pt returning her call, I inform her that she was approve for a 75% discount with [employer] and she need to call the billing dept. Directly to make sure they did the 75 discount on the bill

## 2018-11-16 ENCOUNTER — Encounter: Payer: Self-pay | Admitting: Family Medicine

## 2018-11-16 ENCOUNTER — Ambulatory Visit (INDEPENDENT_AMBULATORY_CARE_PROVIDER_SITE_OTHER): Payer: HRSA Program | Admitting: Family Medicine

## 2018-11-16 ENCOUNTER — Other Ambulatory Visit: Payer: Self-pay

## 2018-11-16 VITALS — BP 106/63 | HR 59 | Temp 97.8°F | Ht 62.0 in | Wt 147.8 lb

## 2018-11-16 DIAGNOSIS — R0602 Shortness of breath: Secondary | ICD-10-CM

## 2018-11-16 DIAGNOSIS — Z09 Encounter for follow-up examination after completed treatment for conditions other than malignant neoplasm: Secondary | ICD-10-CM

## 2018-11-16 DIAGNOSIS — M542 Cervicalgia: Secondary | ICD-10-CM

## 2018-11-16 DIAGNOSIS — E039 Hypothyroidism, unspecified: Secondary | ICD-10-CM

## 2018-11-16 DIAGNOSIS — Z758 Other problems related to medical facilities and other health care: Secondary | ICD-10-CM

## 2018-11-16 DIAGNOSIS — G8929 Other chronic pain: Secondary | ICD-10-CM

## 2018-11-16 DIAGNOSIS — Z789 Other specified health status: Secondary | ICD-10-CM

## 2018-11-16 MED ORDER — ACETAMINOPHEN 500 MG PO TABS: 500.0000 mg | ORAL_TABLET | Freq: Two times a day (BID) | ORAL | 3 refills | Status: DC

## 2018-11-16 NOTE — Progress Notes (Signed)
Patient [facility]   Established Patient Office Visit  Subjective:  Patient ID: Veronica Jensen, female    DOB: June 05, 1975  Age: 43 y.o. MRN: [id number]  CC:  Chief Complaint  Patient presents with  . Follow-up    3 month follow up  discuss change thyroid medication , feeling tried , loss hair, cough at night , neck pain     HPI Veronica Jensen is a 43 year old female who presents for Follow Up today.   Past Medical History:  Diagnosis Date  . Abdominal pain   . Chest pain   . Chronic neck pain   . Hyperthyroidism   . Mood changes   . Nausea   . Ovarian cyst   . Shortness of breath   . Varicose veins of both lower extremities    Current Status: Since her last office visit, she is doing well today. We will use Hispanic Interpreter for communication today. She states that she has been tired and loosing her hair lately, which she r/t her Thyroid medication. She denies fevers, chills, fatigue, recent infections, weight loss, and night sweats. She has not had any headaches, visual changes, dizziness, and falls. No chest pain, heart palpitations, cough and shortness of breath reported. No reports of GI problems such as nausea, vomiting, diarrhea, and constipation. She has no reports of blood in stools, dysuria and hematuria. No depression or anxiety, and denies suicidal ideations, homicidal ideations, or auditory hallucinations. She denies pain today.   Past Surgical History:  Procedure Laterality Date  . MYOMECTOMY      Family History  Problem Relation Age of Onset  . Hypotension Mother   . Thyroid disease Mother   . Hypercholesterolemia Father     Social History   Socioeconomic History  . Marital status: Married    Spouse name: Not on file  . Number of children: Not on file  . Years of education: Not on file  . Highest education level: Not on file  Occupational History  . Not on file  Social Needs  . Financial  resource strain: Not on file  . Food insecurity    Worry: Not on file    Inability: Not on file  . Transportation needs    Medical: Not on file    Non-medical: Not on file  Tobacco Use  . Smoking status: Never Smoker  . Smokeless tobacco: Never Used  Substance and Sexual Activity  . Alcohol use: Never    Frequency: Never  . Drug use: Never  . Sexual activity: Not on file  Lifestyle  . Physical activity    Days per week: Not on file    Minutes per session: Not on file  . Stress: Not on file  Relationships  . Social Herbalist on phone: Not on file    Gets together: Not on file    Attends religious service: Not on file    Active member of club or organization: Not on file    Attends meetings of clubs or organizations: Not on file    Relationship status: Not on file  . Intimate partner violence    Fear of current or ex partner: Not on file    Emotionally abused: Not on file    Physically abused: Not on file    Forced sexual activity: Not on file  Other Topics Concern  . Not on file  Social History Narrative  . Not on file  Outpatient Medications Prior to Visit  Medication Sig Dispense Refill  . levothyroxine (SYNTHROID) 50 MCG tablet TAKE 1 TABLET (50 MCG TOTAL) BY MOUTH DAILY BEFORE BREAKFAST. 30 tablet 3  . ondansetron (ZOFRAN) 4 MG tablet Take 1 tablet (4 mg total) by mouth every 8 (eight) hours as needed for nausea or vomiting. 20 tablet 2  . nitroGLYCERIN (NITROSTAT) 0.4 MG SL tablet Place 1 tablet (0.4 mg total) under the tongue every 5 (five) minutes as needed for chest pain. (Patient not taking: Reported on 10/11/2018) 50 tablet 3   No facility-administered medications prior to visit.     No Known Allergies  ROS Review of Systems  Constitutional: Positive for fatigue.  HENT: Negative.   Eyes: Negative.   Respiratory: Negative.   Cardiovascular: Negative.   Gastrointestinal: Negative.   Endocrine: Negative.   Genitourinary: Negative.    Musculoskeletal: Negative.   Skin: Negative.   Allergic/Immunologic: Negative.   Neurological: Negative.   Hematological: Negative.   Psychiatric/Behavioral: Negative.       Objective:    Physical Exam  Constitutional: She is oriented to person, place, and time. She appears well-developed and well-nourished.  HENT:  Head: Normocephalic and atraumatic.  Eyes: Conjunctivae are normal.  Neck: Normal range of motion. Neck supple.  Cardiovascular: Normal rate, regular rhythm, normal heart sounds and intact distal pulses.  Pulmonary/Chest: Effort normal and breath sounds normal.  Abdominal: Soft. Bowel sounds are normal.  Musculoskeletal: Normal range of motion.  Neurological: She is alert and oriented to person, place, and time. She has normal reflexes.  Skin: Skin is warm and dry.  Psychiatric: She has a normal mood and affect. Her behavior is normal. Judgment and thought content normal.  Nursing note and vitals reviewed.   BP 106/63 (BP Location: Left Arm, Patient Position: Sitting, Cuff Size: Normal)   Pulse (!) 59   Temp 97.8 F (36.6 C) (Oral)   Ht 5\' 2"  (1.575 m)   Wt 147 lb 12.8 oz (67 kg)   SpO2 100%   BMI 27.03 kg/m  Wt Readings from Last 3 Encounters:  11/16/18 147 lb 12.8 oz (67 kg)  10/11/18 145 lb (65.8 kg)  07/27/18 145 lb (65.8 kg)     Health Maintenance Due  Topic Date Due  . HIV Screening  12/18/1989  . TETANUS/TDAP  12/18/1993  . PAP SMEAR-Modifier  12/19/1995  . INFLUENZA VACCINE  09/18/2018    There are no preventive care reminders to display for this patient.  Lab Results  Component Value Date   TSH 1.750 11/16/2018   Lab Results  Component Value Date   WBC 7.8 06/08/2018   HGB 12.4 06/08/2018   HCT 38.5 06/08/2018   MCV 83.3 06/08/2018   PLT 275 06/08/2018   Lab Results  Component Value Date   NA 138 06/08/2018   K 3.6 06/08/2018   CO2 22 06/08/2018   GLUCOSE 78 06/08/2018   BUN 8 06/08/2018   CREATININE 0.71 06/08/2018    BILITOT 0.7 06/08/2018   ALKPHOS 61 06/08/2018   AST 20 06/08/2018   ALT 13 06/08/2018   PROT 6.9 06/08/2018   ALBUMIN 4.3 06/08/2018   CALCIUM 9.0 06/08/2018   ANIONGAP 10 06/08/2018   Lab Results  Component Value Date   CHOL 185 06/08/2018   Lab Results  Component Value Date   HDL 61 06/08/2018   Lab Results  Component Value Date   LDLCALC 110 (H) 06/08/2018   Lab Results  Component Value Date  TRIG 71 06/08/2018   Lab Results  Component Value Date   CHOLHDL 3.0 06/08/2018   Lab Results  Component Value Date   HGBA1C 5.5 06/08/2018   Assessment & Plan:   1. Hypothyroidism, unspecified type - Thyroid Panel With TSH  2. Chronic neck pain - acetaminophen (TYLENOL) 500 MG tablet; Take 1 tablet (500 mg total) by mouth 2 (two) times daily.  Dispense: 60 tablet; Refill: 3  3. Shortness of breath Stable. No signs or symptoms of respiratory distress noted or reported.   4. Language barrier We used Hispanic Interpreter via video conference.   5. Follow up She will follow up in 6 months.   Meds ordered this encounter  Medications  . acetaminophen (TYLENOL) 500 MG tablet    Sig: Take 1 tablet (500 mg total) by mouth 2 (two) times daily.    Dispense:  60 tablet    Refill:  3    Orders Placed This Encounter  Procedures  . Thyroid Panel With TSH    Referral Orders  No referral(s) requested today    Raliegh IpNatalie Ruhee Enck,  MSN, FNP-BC [facility] [address]  [place], [place] [address] [phone] fax    Problem List Items Addressed This Visit      Endocrine   Hypothyroidism - Primary   Relevant Orders   Thyroid Panel With TSH (Completed)     Other   Chronic neck pain   Relevant Medications   acetaminophen (TYLENOL) 500 MG tablet   Shortness of breath    Other Visit Diagnoses    Language barrier       Follow up          Meds ordered this encounter   Medications  . acetaminophen (TYLENOL) 500 MG tablet    Sig: Take 1 tablet (500 mg total) by mouth 2 (two) times daily.    Dispense:  60 tablet    Refill:  3    Follow-up: Return in about 6 months (around 05/16/2019).    Kallie LocksNatalie M Shondra Capps, FNP

## 2018-11-16 NOTE — Patient Instructions (Signed)
Musculoskeletal Pain Musculoskeletal pain refers to aches and pains in your bones, joints, muscles, and the tissues that surround them. This pain can occur in any part of the body. It can last for a short time (acute) or a long time (chronic). A physical exam, lab tests, and imaging studies may be done to find the cause of your musculoskeletal pain. Follow these instructions at home:  Lifestyle  Try to control or lower your stress levels. Stress increases muscle tension and can worsen musculoskeletal pain. It is important to recognize when you are anxious or stressed and learn ways to manage it. This may include: ? Meditation or yoga. ? Cognitive or behavioral therapy. ? Acupuncture or massage therapy.  You may continue all activities unless the activities cause more pain. When the pain gets better, slowly resume your normal activities. Gradually increase the intensity and duration of your activities or exercise. Managing pain, stiffness, and swelling  Take over-the-counter and prescription medicines only as told by your health care provider.  When your pain is severe, bed rest may be helpful. Lie or sit in any position that is comfortable, but get out of bed and walk around at least every couple of hours.  If directed, apply heat to the affected area as often as told by your health care provider. Use the heat source that your health care provider recommends, such as a moist heat pack or a heating pad. ? Place a towel between your skin and the heat source. ? Leave the heat on for 20-30 minutes. ? Remove the heat if your skin turns bright red. This is especially important if you are unable to feel pain, heat, or cold. You may have a greater risk of getting burned.  If directed, put ice on the painful area. ? Put ice in a plastic bag. ? Place a towel between your skin and the bag. ? Leave the ice on for 20 minutes, 2-3 times a day. General instructions  Your health care provider may  recommend that you see a physical therapist. This person can help you come up with a safe exercise program. Do any exercises as told by your physical therapist.  Keep all follow-up visits, including any physical therapy visits, as told by your health care providers. This is important. Contact a health care provider if:  Your pain gets worse.  Medicines do not help ease your pain.  You cannot use the part of your body that hurts, such as your arm, leg, or neck.  You have trouble sleeping.  You have trouble doing your normal activities. Get help right away if:  You have a new injury and your pain is worse or different.  You feel numb or you have tingling in the painful area. Summary  Musculoskeletal pain refers to aches and pains in your bones, joints, muscles, and the tissues that surround them.  This pain can occur in any part of the body.  Your health care provider may recommend that you see a physical therapist. This person can help you come up with a safe exercise program. Do any exercises as told by your physical therapist.  Lower your stress level. Stress can worsen musculoskeletal pain. Ways to lower stress may include meditation, yoga, cognitive or behavioral therapy, acupuncture, and massage therapy. This information is not intended to replace advice given to you by your health care provider. Make sure you discuss any questions you have with your health care provider. Document Released: 02/03/2005 Document Revised: 01/16/2017 Document Reviewed:  03/05/2016 Elsevier Patient Education  2020 Elsevier Inc. Acetaminophen tablets or caplets What is this medicine? ACETAMINOPHEN (a set a MEE noe fen) is a pain reliever. It is used to treat mild pain and fever. This medicine may be used for other purposes; ask your health care provider or pharmacist if you have questions. COMMON BRAND NAME(S): Aceta, Actamin, Anacin Aspirin Free, Genapap, Genebs, Mapap, Pain & Fever, Pain and Fever,  PAIN RELIEF, PAIN RELIEF Extra Strength, Pain Reliever, Panadol, PHARBETOL, Q-Pap, Q-Pap Extra Strength, Tylenol, Tylenol CrushableTablet, Tylenol Extra Strength, XS No Aspirin, XS Pain Reliever What should I tell my health care provider before I take this medicine? They need to know if you have any of these conditions:  if you often drink alcohol  liver disease  an unusual or allergic reaction to acetaminophen, other medicines, foods, dyes, or preservatives  pregnant or trying to get pregnant  breast-feeding How should I use this medicine? Take this medicine by mouth with a glass of water. Follow the directions on the package or prescription label. Take your medicine at regular intervals. Do not take your medicine more often than directed. Talk to your pediatrician regarding the use of this medicine in children. While this drug may be prescribed for children as young as 62 years of age for selected conditions, precautions do apply. Overdosage: If you think you have taken too much of this medicine contact a poison control center or emergency room at once. NOTE: This medicine is only for you. Do not share this medicine with others. What if I miss a dose? If you miss a dose, take it as soon as you can. If it is almost time for your next dose, take only that dose. Do not take double or extra doses. What may interact with this medicine?  alcohol  imatinib  isoniazid  other medicines with acetaminophen This list may not describe all possible interactions. Give your health care provider a list of all the medicines, herbs, non-prescription drugs, or dietary supplements you use. Also tell them if you smoke, drink alcohol, or use illegal drugs. Some items may interact with your medicine. What should I watch for while using this medicine? Tell your doctor or health care professional if the pain lasts more than 10 days (5 days for children), if it gets worse, or if there is a new or different kind  of pain. Also, check with your doctor if a fever lasts for more than 3 days. Do not take other medicines that contain acetaminophen with this medicine. Always read labels carefully. If you have questions, ask your doctor or pharmacist. If you take too much acetaminophen get medical help right away. Too much acetaminophen can be very dangerous and cause liver damage. Even if you do not have symptoms, it is important to get help right away. What side effects may I notice from receiving this medicine? Side effects that you should report to your doctor or health care professional as soon as possible:  allergic reactions like skin rash, itching or hives, swelling of the face, lips, or tongue  breathing problems  fever or sore throat  redness, blistering, peeling or loosening of the skin, including inside the mouth  trouble passing urine or change in the amount of urine  unusual bleeding or bruising  unusually weak or tired  yellowing of the eyes or skin Side effects that usually do not require medical attention (report to your doctor or health care professional if they continue or are bothersome):  headache  nausea, stomach upset This list may not describe all possible side effects. Call your doctor for medical advice about side effects. You may report side effects to FDA at 1-800-FDA-1088. Where should I keep my medicine? Keep out of reach of children. Store at room temperature between 20 and 25 degrees C (68 and 77 degrees F). Protect from moisture and heat. Throw away any unused medicine after the expiration date. NOTE: This sheet is a summary. It may not cover all possible information. If you have questions about this medicine, talk to your doctor, pharmacist, or health care provider.  2020 Elsevier/Gold Standard (2012-09-27 12:54:16)

## 2018-11-17 ENCOUNTER — Telehealth: Payer: Self-pay | Admitting: Family Medicine

## 2018-11-17 LAB — THYROID PANEL WITH TSH
Free Thyroxine Index: 1.7 (ref 1.2–4.9)
T3 Uptake Ratio: 23 % — ABNORMAL LOW (ref 24–39)
T4, Total: 7.2 ug/dL (ref 4.5–12.0)
TSH: 1.75 u[IU]/mL (ref 0.450–4.500)

## 2018-11-17 NOTE — Telephone Encounter (Signed)
Patient called back stating she has some questions for you. Please follow up. States her break is 12-1 please follow up.

## 2018-11-17 NOTE — Telephone Encounter (Signed)
LM to call me back.

## 2018-12-02 ENCOUNTER — Ambulatory Visit (HOSPITAL_COMMUNITY): Payer: Self-pay

## 2019-01-07 ENCOUNTER — Telehealth: Payer: Self-pay

## 2019-01-07 ENCOUNTER — Other Ambulatory Visit: Payer: Self-pay

## 2019-01-07 ENCOUNTER — Other Ambulatory Visit: Payer: Self-pay | Admitting: Family Medicine

## 2019-01-07 DIAGNOSIS — E039 Hypothyroidism, unspecified: Secondary | ICD-10-CM

## 2019-01-07 MED ORDER — LEVOTHYROXINE SODIUM 50 MCG PO TABS: 50.0000 ug | ORAL_TABLET | Freq: Every day | ORAL | 6 refills | Status: DC

## 2019-01-07 NOTE — Telephone Encounter (Signed)
Patient is requesting the Brand name for Rx Levothyroxine. Please send to [place].

## 2019-02-01 ENCOUNTER — Encounter (HOSPITAL_COMMUNITY): Payer: Self-pay

## 2019-02-01 ENCOUNTER — Ambulatory Visit (HOSPITAL_COMMUNITY)
Admission: RE | Admit: 2019-02-01 | Discharge: 2019-02-01 | Disposition: A | Discharge status: Home / Self Care | Payer: Self-pay | Source: Ambulatory Visit | Attending: Obstetrics and Gynecology | Admitting: Obstetrics and Gynecology

## 2019-02-01 ENCOUNTER — Other Ambulatory Visit: Payer: Self-pay

## 2019-02-01 DIAGNOSIS — Z1239 Encounter for other screening for malignant neoplasm of breast: Secondary | ICD-10-CM | POA: Insufficient documentation

## 2019-02-01 NOTE — Progress Notes (Signed)
No complaints today.   Pap Smear: Pap smear not completed today. Last Pap smear was in November 2019 in [place] and normal per patient. Per patient has no history of an abnormal Pap smear. No Pap smear results are in Epic.  Physical exam: Breasts Breasts symmetrical. No skin abnormalities bilateral breasts. No nipple retraction bilateral breasts. No nipple discharge bilateral breasts. No lymphadenopathy. No lumps palpated bilateral breasts. No complaints of pain or tenderness on exam. Referred patient to the [place] for a screening mammogram. Appointment scheduled for Thursday, February 03, 2019 at 0930.        Pelvic/Bimanual No Pap smear completed today since last Pap smear was in November 2019 per patient. Pap smear not indicated per BCCCP guidelines.   Smoking History: Patient has never smoked.  Patient Navigation: Patient education provided. Access to services provided for patient through [facility] program. Spanish interpreter provided.   Breast and Cervical Cancer Risk Assessment: Patient has no family history of breast cancer, known genetic mutations, or radiation treatment to the chest before age 54. Patient has no history of cervical dysplasia, immunocompromised, or DES exposure in-utero.  Risk Assessment    Risk Scores      02/01/2019   Last edited by: Loletta Parish, RN   5-year risk: 0.5 %   Lifetime risk: 6.9 %         Used Spanish interpreter Rudene Anda from [place].

## 2019-02-01 NOTE — Patient Instructions (Signed)
Explained breast self awareness with [facility]. Patient did not need a Pap smear today due to last Pap smear was in November 2019 per patient. Let her know B[facility]CP will cover Pap smears every 3 years unless has a history of abnormal Pap smears. Referred patient to the [facility] for a screening mammogram. Appointment scheduled for Thursday, February 03, 2019 at 0930. Patient aware of appointment and will be there. Let patient know the [facility] will follow up with her within the next couple weeks with results of mammogram by letter or phone. [employer] verbalized understanding.  Agron Swiney, Arvil Chaco, RN 2:41 PM

## 2019-02-03 ENCOUNTER — Other Ambulatory Visit: Payer: Self-pay

## 2019-02-03 ENCOUNTER — Ambulatory Visit
Admission: RE | Admit: 2019-02-03 | Discharge: 2019-02-03 | Disposition: A | Discharge status: Home / Self Care | Payer: No Typology Code available for payment source | Source: Ambulatory Visit | Attending: Obstetrics and Gynecology | Admitting: Obstetrics and Gynecology

## 2019-02-03 DIAGNOSIS — Z1231 Encounter for screening mammogram for malignant neoplasm of breast: Secondary | ICD-10-CM

## 2019-04-27 ENCOUNTER — Telehealth: Payer: Self-pay | Admitting: Family Medicine

## 2019-04-27 ENCOUNTER — Ambulatory Visit (INDEPENDENT_AMBULATORY_CARE_PROVIDER_SITE_OTHER): Payer: PRIVATE HEALTH INSURANCE | Admitting: Family Medicine

## 2019-04-27 ENCOUNTER — Encounter: Payer: Self-pay | Admitting: Family Medicine

## 2019-04-27 ENCOUNTER — Other Ambulatory Visit: Payer: Self-pay

## 2019-04-27 DIAGNOSIS — E039 Hypothyroidism, unspecified: Secondary | ICD-10-CM | POA: Diagnosis not present

## 2019-04-27 DIAGNOSIS — M542 Cervicalgia: Secondary | ICD-10-CM | POA: Diagnosis not present

## 2019-04-27 DIAGNOSIS — G8929 Other chronic pain: Secondary | ICD-10-CM

## 2019-04-27 DIAGNOSIS — Z09 Encounter for follow-up examination after completed treatment for conditions other than malignant neoplasm: Secondary | ICD-10-CM

## 2019-04-27 DIAGNOSIS — Z789 Other specified health status: Secondary | ICD-10-CM

## 2019-04-27 DIAGNOSIS — Z758 Other problems related to medical facilities and other health care: Secondary | ICD-10-CM

## 2019-04-27 NOTE — Telephone Encounter (Signed)
Pt called stating they were returning a missed call. None of the nurses called. Pt said you stated you would call them back regarding something. Please call pt back.

## 2019-04-28 NOTE — Progress Notes (Signed)
Virtual Visit via Telephone Note  I connected with Veronica Jensen on 04/28/19 at  3:20 PM EST by telephone and verified that I am speaking with the correct person using two identifiers.   I discussed the limitations, risks, security and privacy concerns of performing an evaluation and management service by telephone and the availability of in person appointments. I also discussed with the patient that there may be a patient responsible charge related to this service. The patient expressed understanding and agreed to proceed.   History of Present Illness:  Past Surgical History:  Procedure Laterality Date  . MYOMECTOMY      Social History   Socioeconomic History  . Marital status: Married    Spouse name: Not on file  . Number of children: Not on file  . Years of education: Not on file  . Highest education level: Bachelor's degree (e.g., BA, AB, BS)  Occupational History  . Not on file  Tobacco Use  . Smoking status: Never Smoker  . Smokeless tobacco: Never Used  Substance and Sexual Activity  . Alcohol use: Not Currently  . Drug use: Not Currently  . Sexual activity: Not Currently    Birth control/protection: None  Other Topics Concern  . Not on file  Social History Narrative  . Not on file   Social Determinants of Health   Financial Resource Strain:   . Difficulty of Paying Living Expenses:   Food Insecurity:   . Worried About Charity fundraiser in the Last Year:   . Arboriculturist in the Last Year:   Transportation Needs: Unmet Transportation Needs  . Lack of Transportation (Medical): Yes  . Lack of Transportation (Non-Medical): Not on file  Physical Activity:   . Days of Exercise per Week:   . Minutes of Exercise per Session:   Stress:   . Feeling of Stress :   Social Connections:   . Frequency of Communication with Friends and Family:   . Frequency of Social Gatherings with Friends and Family:   . Attends Religious Services:   . Active Member of Clubs  or Organizations:   . Attends Archivist Meetings:   Marland Kitchen Marital Status:   Intimate Partner Violence:   . Fear of Current or Ex-Partner:   . Emotionally Abused:   Marland Kitchen Physically Abused:   . Sexually Abused:    Family History  Problem Relation Age of Onset  . Hypotension Mother   . Thyroid disease Mother   . Hypercholesterolemia Father     Past Medical History:  Diagnosis Date  . Abdominal pain   . Chest pain   . Chronic neck pain   . Hyperthyroidism   . Mood changes   . Nausea   . Ovarian cyst   . Shortness of breath   . Varicose veins of both lower extremities    No Known Allergies   Past Medical History:  Diagnosis Date  . Abdominal pain   . Chest pain   . Chronic neck pain   . Hyperthyroidism   . Mood changes   . Nausea   . Ovarian cyst   . Shortness of breath   . Varicose veins of both lower extremities       Observations/Objective: Telephone Virtual Visit   Assessment and Plan:  1. Hypothyroidism, unspecified type Stable. She will continue thyroid medication as prescribed.   2. Chronic neck pain  3. Language barrier  4. Follow up Keep follow up appointment.   No  orders of the defined types were placed in this encounter.   No orders of the defined types were placed in this encounter.  Referral Orders  No referral(s) requested today    Raliegh Ip,  MSN, FNP-BC [facility] [address] [place], [place] [address] [phone] fax     I discussed the assessment and treatment plan with the patient. The patient was provided an opportunity to ask questions and all were answered. The patient agreed with the plan and demonstrated an understanding of the instructions.   The patient was advised to call back or seek an in-person evaluation if the symptoms worsen or if the condition fails to improve as anticipated.  I provided 15 minutes of  non-face-to-face time during this encounter.   Kallie Locks, FNP

## 2019-05-16 ENCOUNTER — Ambulatory Visit: Payer: Self-pay | Admitting: Family Medicine

## 2019-05-23 ENCOUNTER — Ambulatory Visit (INDEPENDENT_AMBULATORY_CARE_PROVIDER_SITE_OTHER): Payer: PRIVATE HEALTH INSURANCE

## 2019-05-23 ENCOUNTER — Encounter (HOSPITAL_COMMUNITY): Payer: Self-pay

## 2019-05-23 ENCOUNTER — Ambulatory Visit (HOSPITAL_COMMUNITY)
Admission: EM | Admit: 2019-05-23 | Discharge: 2019-05-23 | Disposition: A | Discharge status: Home / Self Care | Payer: PRIVATE HEALTH INSURANCE | Attending: Family Medicine | Admitting: Family Medicine

## 2019-05-23 ENCOUNTER — Other Ambulatory Visit: Payer: Self-pay

## 2019-05-23 DIAGNOSIS — M545 Low back pain: Secondary | ICD-10-CM

## 2019-05-23 DIAGNOSIS — S39012A Strain of muscle, fascia and tendon of lower back, initial encounter: Secondary | ICD-10-CM

## 2019-05-23 MED ORDER — NAPROXEN 500 MG PO TABS: 500.0000 mg | ORAL_TABLET | Freq: Two times a day (BID) | ORAL | 0 refills | Status: DC

## 2019-05-23 NOTE — ED Triage Notes (Signed)
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 Pt states she has a pain in her lower back. X 3 days.

## 2019-05-23 NOTE — Discharge Instructions (Signed)
Back x ray is normal Take the naproxen 2 x a day with food for back pain Follow up with your primary care doctor

## 2019-05-23 NOTE — ED Provider Notes (Signed)
[facility]    CSN: [id number] Arrival date & time: 05/23/19  1413      History   Chief Complaint Chief Complaint  Patient presents with  . Back Pain    HPI Veronica Jensen is a 44 y.o. female.   HPI   Patient is been working in a daycare setting for about 3 years.  Recently she has been working in an area with more toddlers.  She states some of the tablets are very heavy.  She does a lot of bending and lifting.  Sometimes twisting.  She states that yesterday she had a lot of back pain pain.  Today it is a little bit better.  The pain is in the left low back SI region.  Denies any radiation of pain.  Denies  numbness or weakness.  No bowel or bladder complaint. She wants to know if it is safe for her to continue to work in childcare where she has a back condition  Past Medical History:  Diagnosis Date  . Abdominal pain   . Chest pain   . Chronic neck pain   . Hyperthyroidism   . Mood changes   . Nausea   . Ovarian cyst   . Shortness of breath   . Varicose veins of both lower extremities     Patient Active Problem List   Diagnosis Date Noted  . Screening breast examination 02/01/2019  . History of ovarian cyst 07/15/2018  . Varicose veins of bilateral lower extremities with pain 07/15/2018  . Chronic neck pain 07/15/2018  . Hypothyroidism 06/08/2018  . Chest pain 06/08/2018  . Shortness of breath 06/08/2018  . Nausea 06/08/2018    Past Surgical History:  Procedure Laterality Date  . MYOMECTOMY      OB History    Gravida  0   Para  0   Term  0   Preterm  0   AB  0   Living  0     SAB  0   TAB  0   Ectopic  0   Multiple  0   Live Births  0            Home Medications    Prior to Admission medications   Medication Sig Start Date End Date Taking? Authorizing Provider  levothyroxine (SYNTHROID) 50 MCG tablet Take 1 tablet (50 mcg total) by mouth daily before breakfast. 01/07/19   Kallie Locks, FNP  naproxen  (NAPROSYN) 500 MG tablet Take 1 tablet (500 mg total) by mouth 2 (two) times daily. 05/23/19   Eustace Moore, MD  nitroGLYCERIN (NITROSTAT) 0.4 MG SL tablet Place 1 tablet (0.4 mg total) under the tongue every 5 (five) minutes as needed for chest pain. Patient not taking: Reported on 04/27/2019 06/08/18 05/23/19  Kallie Locks, FNP    Family History Family History  Problem Relation Age of Onset  . Hypotension Mother   . Thyroid disease Mother   . Hypercholesterolemia Father     Social History Social History   Tobacco Use  . Smoking status: Never Smoker  . Smokeless tobacco: Never Used  Substance Use Topics  . Alcohol use: Not Currently  . Drug use: Not Currently     Allergies   Patient has no known allergies.   Review of Systems Review of Systems  Musculoskeletal: Positive for back pain.     Physical Exam Triage Vital Signs ED Triage Vitals  Enc Vitals Group     BP 05/23/19  1514 105/74     Pulse Rate 05/23/19 1514 61     Resp 05/23/19 1514 16     Temp 05/23/19 1514 98.4 F (36.9 C)     Temp Source 05/23/19 1514 Oral     SpO2 05/23/19 1514 100 %     Weight 05/23/19 1512 137 lb (62.1 kg)     Height --      Head Circumference --      Peak Flow --      Pain Score 05/23/19 1512 5     Pain Loc --      Pain Edu? --      Excl. in [place]? --    No data found.  Updated Vital Signs BP 105/74 (BP Location: Right Arm)   Pulse 61   Temp 98.4 F (36.9 C) (Oral)   Resp 16   Wt 62.1 kg   LMP 04/26/2019   SpO2 100%   BMI 25.06 kg/m      Physical Exam Constitutional:      General: She is not in acute distress.    Appearance: She is well-developed.  HENT:     Head: Normocephalic and atraumatic.  Eyes:     Conjunctiva/sclera: Conjunctivae normal.     Pupils: Pupils are equal, round, and reactive to light.  Cardiovascular:     Rate and Rhythm: Normal rate.  Pulmonary:     Effort: Pulmonary effort is normal. No respiratory distress.  Abdominal:     General:  There is no distension.     Palpations: Abdomen is soft.  Musculoskeletal:        General: Normal range of motion.     Cervical back: Normal range of motion.     Comments: Lumbar spine is straight and symmetric. Full range of motion. No tenderness or muscle spasm. Strength, sensation, range of motion, and reflexes are normal in both lower extremities. Straight leg raise is negative bilateral. Mild tenderness L SI region   Skin:    General: Skin is warm and dry.  Neurological:     Mental Status: She is alert.      UC Treatments / Results  Labs (all labs ordered are listed, but only abnormal results are displayed) Labs Reviewed - No data to display  EKG   Radiology DG Lumbar Spine Complete  Result Date: 05/23/2019 CLINICAL DATA:  Lower back pain for 3 days, does lifting at work, pain on LEFT side at L4-L5 aspect no prior injury EXAM: LUMBAR SPINE - COMPLETE 4+ VIEW COMPARISON:  None FINDINGS: Five non-rib-bearing lumbar vertebra. Osseous mineralization normal for technique. Vertebral body and disc space heights maintained. No fracture, subluxation or bone destruction. No spondylolysis. SI joints preserved. IMPRESSION: Normal exam. Electronically Signed   By: Lavonia Dana M.D.   On: 05/23/2019 16:03    Procedures Procedures (including critical care time)  Medications Ordered in UC Medications - No data to display  Initial Impression / Assessment and Plan / UC Course  I have reviewed the triage vital signs and the nursing notes.  Pertinent labs & imaging results that were available during my care of the patient were reviewed by me and considered in my medical decision making (see chart for details).     Told the patient that she has some mild SI strain.  It is probably from the repetitive heavy lifting but as long as she is careful with her lifting technique she should be able to continue to work without difficulty.  Her x-rays are  negative.  Follow-up with primary care Final  Clinical Impressions(s) / UC Diagnoses   Final diagnoses:  Lumbar strain, initial encounter     Discharge Instructions     Back x ray is normal Take the naproxen 2 x a day with food for back pain Follow up with your primary care doctor    ED Prescriptions    Medication Sig Dispense Auth. Provider   naproxen (NAPROSYN) 500 MG tablet Take 1 tablet (500 mg total) by mouth 2 (two) times daily. 30 tablet Eustace Moore, MD     PDMP not reviewed this encounter.   Eustace Moore, MD 05/23/19 [phone]

## 2019-06-01 ENCOUNTER — Encounter: Payer: Self-pay | Admitting: Family Medicine

## 2019-06-01 ENCOUNTER — Ambulatory Visit (INDEPENDENT_AMBULATORY_CARE_PROVIDER_SITE_OTHER): Payer: PRIVATE HEALTH INSURANCE | Admitting: Family Medicine

## 2019-06-01 ENCOUNTER — Other Ambulatory Visit: Payer: Self-pay

## 2019-06-01 VITALS — BP 97/65 | HR 75 | Temp 98.2°F | Ht 62.0 in | Wt 137.2 lb

## 2019-06-01 DIAGNOSIS — Z0000 Encounter for general adult medical examination without abnormal findings: Secondary | ICD-10-CM

## 2019-06-01 DIAGNOSIS — R0602 Shortness of breath: Secondary | ICD-10-CM | POA: Diagnosis not present

## 2019-06-01 DIAGNOSIS — Z603 Acculturation difficulty: Secondary | ICD-10-CM

## 2019-06-01 DIAGNOSIS — E039 Hypothyroidism, unspecified: Secondary | ICD-10-CM

## 2019-06-01 DIAGNOSIS — Z758 Other problems related to medical facilities and other health care: Secondary | ICD-10-CM

## 2019-06-01 DIAGNOSIS — J45909 Unspecified asthma, uncomplicated: Secondary | ICD-10-CM | POA: Diagnosis not present

## 2019-06-01 DIAGNOSIS — Z789 Other specified health status: Secondary | ICD-10-CM

## 2019-06-01 DIAGNOSIS — Z09 Encounter for follow-up examination after completed treatment for conditions other than malignant neoplasm: Secondary | ICD-10-CM

## 2019-06-01 LAB — GLUCOSE, POCT (MANUAL RESULT ENTRY): POC Glucose: 107 mg/dl — AB (ref 70–99)

## 2019-06-01 LAB — POCT URINALYSIS DIPSTICK
Bilirubin, UA: NEGATIVE
Blood, UA: NEGATIVE
Glucose, UA: NEGATIVE
Ketones, UA: NEGATIVE
Leukocytes, UA: NEGATIVE
Nitrite, UA: NEGATIVE
Protein, UA: NEGATIVE
Spec Grav, UA: 1.025 (ref 1.010–1.025)
Urobilinogen, UA: 0.2 E.U./dL
pH, UA: 7 (ref 5.0–8.0)

## 2019-06-01 LAB — POCT GLYCOSYLATED HEMOGLOBIN (HGB A1C): Hemoglobin A1C: 5.4 % (ref 4.0–5.6)

## 2019-06-01 MED ORDER — ALBUTEROL SULFATE HFA 108 (90 BASE) MCG/ACT IN AERS: 2.0000 | INHALATION_SPRAY | Freq: Four times a day (QID) | RESPIRATORY_TRACT | 11 refills | Status: DC | PRN

## 2019-06-01 NOTE — Progress Notes (Signed)
[facility]   Hospital Follow Up   Subjective:  Patient ID: Veronica Jensen, female    DOB: [birth year]  Age: 44 y.o. MRN: [id number]  CC:  Chief Complaint  Patient presents with  . Hospitalization Follow-up    05/23/2019 Lumbar Strain,   . Follow-up    HPI Veronica Jensen is a 44 year old female who presents for Hospital Follow Up today.   Past Medical History:  Diagnosis Date  . Abdominal pain   . Asthma in adult without complication   . Chest pain   . Chronic neck pain   . Hyperthyroidism   . Mood changes   . Nausea   . Ovarian cyst   . Shortness of breath   . Varicose veins of both lower extremities     Current Status: Since her last office visit, she is doing well with no complaints. We will use video Hispanic Interpreter today. She states that she was diagnosed with Asthma and received an Rx for an inhaler. She has not used inhaler as of yet. She denies fevers, chills, fatigue, recent infections, weight loss, and night sweats. She has not had any headaches, visual changes, dizziness, and falls. No chest pain, heart palpitations, cough and shortness of breath reported. Denies GI problems such as nausea, vomiting, diarrhea, and constipation. She has no reports of blood in stools, dysuria and hematuria. No depression or anxiety, and denies suicidal ideations, homicidal ideations, or auditory hallucinations. She is taking all medications as prescribed. She denies pain today.   Past Surgical History:  Procedure Laterality Date  . MYOMECTOMY      Family History  Problem Relation Age of Onset  . Hypotension Mother   . Thyroid disease Mother   . Hypercholesterolemia Father     Social History   Socioeconomic History  . Marital status: Married    Spouse name: Not on file  . Number of children: Not on file  . Years of education: Not on file  . Highest education level: Bachelor's degree (e.g., BA, AB, BS)   Occupational History  . Not on file  Tobacco Use  . Smoking status: Never Smoker  . Smokeless tobacco: Never Used  Substance and Sexual Activity  . Alcohol use: Not Currently  . Drug use: Not Currently  . Sexual activity: Not Currently    Birth control/protection: None  Other Topics Concern  . Not on file  Social History Narrative  . Not on file   Social Determinants of Health   Financial Resource Strain:   . Difficulty of Paying Living Expenses:   Food Insecurity:   . Worried About Programme researcher, broadcasting/film/video in the Last Year:   . Barista in the Last Year:   Transportation Needs: Unmet Transportation Needs  . Lack of Transportation (Medical): Yes  . Lack of Transportation (Non-Medical): Not on file  Physical Activity:   . Days of Exercise per Week:   . Minutes of Exercise per Session:   Stress:   . Feeling of Stress :   Social Connections:   . Frequency of Communication with Friends and Family:   . Frequency of Social Gatherings with Friends and Family:   . Attends Religious Services:   . Active Member of Clubs or Organizations:   . Attends Banker Meetings:   Marland Kitchen Marital Status:   Intimate Partner Violence:   . Fear of Current or Ex-Partner:   . Emotionally Abused:   .  Physically Abused:   . Sexually Abused:     Outpatient Medications Prior to Visit  Medication Sig Dispense Refill  . levothyroxine (SYNTHROID) 50 MCG tablet Take 1 tablet (50 mcg total) by mouth daily before breakfast. 30 tablet 6  . naproxen (NAPROSYN) 500 MG tablet Take 1 tablet (500 mg total) by mouth 2 (two) times daily. (Patient not taking: Reported on 06/01/2019) 30 tablet 0   No facility-administered medications prior to visit.    No Known Allergies  ROS Review of Systems  Constitutional: Negative.   HENT: Negative.   Eyes: Negative.   Respiratory: Positive for shortness of breath (occasional ).   Cardiovascular: Negative.   Gastrointestinal: Negative.   Endocrine:  Negative.   Genitourinary: Negative.   Musculoskeletal: Negative.   Skin: Negative.   Allergic/Immunologic: Negative.   Neurological: Negative.   Hematological: Negative.   Psychiatric/Behavioral: Negative.       Objective:    Physical Exam  Constitutional: She is oriented to person, place, and time. She appears well-developed and well-nourished.  HENT:  Head: Normocephalic and atraumatic.  Eyes: Conjunctivae are normal.  Cardiovascular: Normal rate, regular rhythm, normal heart sounds and intact distal pulses.  Pulmonary/Chest: Effort normal.  Abdominal: Soft. Bowel sounds are normal.  Musculoskeletal:        General: Normal range of motion.     Cervical back: Normal range of motion and neck supple.  Neurological: She is alert and oriented to person, place, and time.  Skin: Skin is warm and dry.  Psychiatric: She has a normal mood and affect. Her behavior is normal. Judgment and thought content normal.  Nursing note and vitals reviewed.   BP 97/65   Pulse 75   Temp 98.2 F (36.8 C) (Oral)   Ht 5\' 2"  (1.575 m)   Wt 137 lb 3.2 oz (62.2 kg)   LMP 05/29/2019   SpO2 99%   BMI 25.09 kg/m  Wt Readings from Last 3 Encounters:  06/01/19 137 lb 3.2 oz (62.2 kg)  05/23/19 137 lb (62.1 kg)  02/01/19 140 lb (63.5 kg)     Health Maintenance Due  Topic Date Due  . HIV Screening  Never done  . TETANUS/TDAP  Never done  . PAP SMEAR-Modifier  Never done    There are no preventive care reminders to display for this patient.  Lab Results  Component Value Date   TSH 1.750 11/16/2018   Lab Results  Component Value Date   WBC 7.8 06/08/2018   HGB 12.4 06/08/2018   HCT 38.5 06/08/2018   MCV 83.3 06/08/2018   PLT 275 06/08/2018   Lab Results  Component Value Date   NA 138 06/08/2018   K 3.6 06/08/2018   CO2 22 06/08/2018   GLUCOSE 78 06/08/2018   BUN 8 06/08/2018   CREATININE 0.71 06/08/2018   BILITOT 0.7 06/08/2018   ALKPHOS 61 06/08/2018   AST 20 06/08/2018    ALT 13 06/08/2018   PROT 6.9 06/08/2018   ALBUMIN 4.3 06/08/2018   CALCIUM 9.0 06/08/2018   ANIONGAP 10 06/08/2018   Lab Results  Component Value Date   CHOL 185 06/08/2018   Lab Results  Component Value Date   HDL 61 06/08/2018   Lab Results  Component Value Date   LDLCALC 110 (H) 06/08/2018   Lab Results  Component Value Date   TRIG 71 06/08/2018   Lab Results  Component Value Date   CHOLHDL 3.0 06/08/2018   Lab Results  Component Value Date  HGBA1C 5.4 06/01/2019      Assessment & Plan:   1. Mild asthma without complication, unspecified whether persistent - albuterol (VENTOLIN HFA) 108 (90 Base) MCG/ACT inhaler; Inhale 2 puffs into the lungs every 6 (six) hours as needed for wheezing or shortness of breath.  Dispense: 18 g; Refill: 11  2. Shortness of breath Stable. No signs or symptoms of respiratory distress reported or noted today.   3. Hypothyroidism, unspecified type  4. Language barrier  5. Healthcare maintenance - POCT glucose (manual entry) - POCT urinalysis dipstick - POCT glycosylated hemoglobin (Hb A1C)  6. Follow up She will follow up in 6 months.   Meds ordered this encounter  Medications  . albuterol (VENTOLIN HFA) 108 (90 Base) MCG/ACT inhaler    Sig: Inhale 2 puffs into the lungs every 6 (six) hours as needed for wheezing or shortness of breath.    Dispense:  18 g    Refill:  11    Orders Placed This Encounter  Procedures  . POCT glucose (manual entry)  . POCT urinalysis dipstick  . POCT glycosylated hemoglobin (Hb A1C)    Referral Orders  No referral(s) requested today    Raliegh Ip,  MSN, FNP-BC [facility] [address] [place], [place] [address] [phone] fax    Problem List Items Addressed This Visit      Endocrine   Hypothyroidism     Other   Shortness of breath    Other Visit Diagnoses    Mild asthma without  complication, unspecified whether persistent    -  Primary   Relevant Medications   albuterol (VENTOLIN HFA) 108 (90 Base) MCG/ACT inhaler   Language barrier       Healthcare maintenance       Relevant Orders   POCT glucose (manual entry) (Completed)   POCT urinalysis dipstick (Completed)   POCT glycosylated hemoglobin (Hb A1C) (Completed)   Follow up          Meds ordered this encounter  Medications  . albuterol (VENTOLIN HFA) 108 (90 Base) MCG/ACT inhaler    Sig: Inhale 2 puffs into the lungs every 6 (six) hours as needed for wheezing or shortness of breath.    Dispense:  18 g    Refill:  11    Follow-up: No follow-ups on file.    Kallie Locks, FNP

## 2019-06-02 ENCOUNTER — Encounter: Payer: Self-pay | Admitting: Family Medicine

## 2019-06-02 DIAGNOSIS — J45909 Unspecified asthma, uncomplicated: Secondary | ICD-10-CM | POA: Insufficient documentation

## 2019-06-02 DIAGNOSIS — Z789 Other specified health status: Secondary | ICD-10-CM | POA: Insufficient documentation

## 2019-06-02 DIAGNOSIS — Z758 Other problems related to medical facilities and other health care: Secondary | ICD-10-CM | POA: Insufficient documentation

## 2019-06-02 LAB — TSH: TSH: 1.54 u[IU]/mL (ref 0.450–4.500)

## 2019-06-02 LAB — COMPREHENSIVE METABOLIC PANEL
ALT: 10 IU/L (ref 0–32)
AST: 21 IU/L (ref 0–40)
Albumin/Globulin Ratio: 1.9 (ref 1.2–2.2)
Albumin: 4.4 g/dL (ref 3.8–4.8)
Alkaline Phosphatase: 66 IU/L (ref 39–117)
BUN/Creatinine Ratio: 17 (ref 9–23)
BUN: 14 mg/dL (ref 6–24)
Bilirubin Total: 0.4 mg/dL (ref 0.0–1.2)
CO2: 24 mmol/L (ref 20–29)
Calcium: 9.5 mg/dL (ref 8.7–10.2)
Chloride: 103 mmol/L (ref 96–106)
Creatinine, Ser: 0.83 mg/dL (ref 0.57–1.00)
GFR calc Af Amer: 99 mL/min/{1.73_m2} (ref >59)
GFR calc non Af Amer: 86 mL/min/{1.73_m2} (ref >59)
Globulin, Total: 2.3 g/dL (ref 1.5–4.5)
Glucose: 63 mg/dL — ABNORMAL LOW (ref 65–99)
Potassium: 4.2 mmol/L (ref 3.5–5.2)
Sodium: 140 mmol/L (ref 134–144)
Total Protein: 6.7 g/dL (ref 6.0–8.5)

## 2019-06-02 LAB — VITAMIN D 25 HYDROXY (VIT D DEFICIENCY, FRACTURES): Vit D, 25-Hydroxy: 25.9 ng/mL — ABNORMAL LOW (ref 30.0–100.0)

## 2019-06-02 LAB — LIPID PANEL
Chol/HDL Ratio: 2.9 ratio (ref 0.0–4.4)
Cholesterol, Total: 173 mg/dL (ref 100–199)
HDL: 59 mg/dL (ref >39)
LDL Chol Calc (NIH): 100 mg/dL — ABNORMAL HIGH (ref 0–99)
Triglycerides: 76 mg/dL (ref 0–149)
VLDL Cholesterol Cal: 14 mg/dL (ref 5–40)

## 2019-06-02 LAB — CBC WITH DIFFERENTIAL/PLATELET
Basophils Absolute: 0.1 10*3/uL (ref 0.0–0.2)
Basos: 1 %
EOS (ABSOLUTE): 0.1 10*3/uL (ref 0.0–0.4)
Eos: 2 %
Hematocrit: 37.5 % (ref 34.0–46.6)
Hemoglobin: 12.1 g/dL (ref 11.1–15.9)
Immature Grans (Abs): 0 10*3/uL (ref 0.0–0.1)
Immature Granulocytes: 0 %
Lymphocytes Absolute: 1.8 10*3/uL (ref 0.7–3.1)
Lymphs: 34 %
MCH: 26.5 pg — ABNORMAL LOW (ref 26.6–33.0)
MCHC: 32.3 g/dL (ref 31.5–35.7)
MCV: 82 fL (ref 79–97)
Monocytes Absolute: 0.4 10*3/uL (ref 0.1–0.9)
Monocytes: 7 %
Neutrophils Absolute: 3 10*3/uL (ref 1.4–7.0)
Neutrophils: 56 %
Platelets: 367 10*3/uL (ref 150–450)
RBC: 4.57 x10E6/uL (ref 3.77–5.28)
RDW: 12.8 % (ref 11.7–15.4)
WBC: 5.3 10*3/uL (ref 3.4–10.8)

## 2019-06-02 LAB — VITAMIN B12: Vitamin B-12: 663 pg/mL (ref 232–1245)

## 2019-06-03 ENCOUNTER — Encounter (HOSPITAL_COMMUNITY): Payer: Self-pay

## 2019-06-22 ENCOUNTER — Telehealth: Payer: Self-pay

## 2019-06-22 NOTE — Telephone Encounter (Signed)
[employer] usedClaudia Desanctis #[id number] Ph: [phone]. To notify patient for most recent stable  Labs.  Message left for call back.

## 2019-06-23 NOTE — Telephone Encounter (Signed)
Patient aware of labs. Address updated.

## 2019-06-28 ENCOUNTER — Telehealth: Payer: Self-pay | Admitting: Pediatric Intensive Care

## 2019-06-28 NOTE — Telephone Encounter (Signed)
Call to client- ID x 2 with Iverson Alamin for interpretation. 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 She states this would be very helpful given her situation with her husband. She states that she would like to have some emotional support for both her and [place]. She has not discussed her concerns regarding Jonetta Speak' emotional status with his providers. CN encouraged client to discuss this with [place] and his providers. CN will send information regarding [facility] [place]. Client also states that she thinks her husband would benefit from going [facility]. CN states she can help communicate that with clinic staff. CN will follow up with client on Wednesday at 1215. Shann Medal RN BSN CNP [phone]

## 2019-09-09 ENCOUNTER — Other Ambulatory Visit: Payer: Self-pay | Admitting: Family Medicine

## 2019-09-09 DIAGNOSIS — E039 Hypothyroidism, unspecified: Secondary | ICD-10-CM

## 2019-09-22 ENCOUNTER — Telehealth: Payer: Self-pay | Admitting: Family Medicine

## 2019-09-22 NOTE — Telephone Encounter (Signed)
Copied from CRM [id number]. Topic: General - Inquiry >> Sep 20, 2019 12:30 PM Crist Infante wrote: Reason for CRM: husband calling to ask Mikle Bosworth to call the pt at this number. [phone]   Pt can receive calls at this number after 12 pm If you cannot reach pt, you can call Luis. >> Sep 21, 2019  2:12 PM Wyonia Hough E wrote: Pt would like the appt with carlos to be on a tuesday as late as possible/ please advise

## 2019-10-12 ENCOUNTER — Ambulatory Visit: Payer: PRIVATE HEALTH INSURANCE

## 2019-10-12 ENCOUNTER — Other Ambulatory Visit: Payer: Self-pay

## 2019-11-30 ENCOUNTER — Telehealth: Payer: Self-pay

## 2019-11-30 NOTE — Telephone Encounter (Signed)
[phone] w/[employer] contacted pt to confirm appt no ans lvm

## 2019-12-01 ENCOUNTER — Other Ambulatory Visit: Payer: Self-pay | Admitting: Critical Care Medicine

## 2019-12-01 ENCOUNTER — Other Ambulatory Visit: Payer: Self-pay

## 2019-12-01 ENCOUNTER — Encounter: Payer: Self-pay | Admitting: Critical Care Medicine

## 2019-12-01 ENCOUNTER — Ambulatory Visit: Payer: Self-pay | Attending: Critical Care Medicine | Admitting: Critical Care Medicine

## 2019-12-01 VITALS — BP 110/75 | HR 65 | Temp 97.7°F | Resp 16 | Ht 62.11 in | Wt 138.0 lb

## 2019-12-01 DIAGNOSIS — E039 Hypothyroidism, unspecified: Secondary | ICD-10-CM

## 2019-12-01 DIAGNOSIS — J4520 Mild intermittent asthma, uncomplicated: Secondary | ICD-10-CM

## 2019-12-01 DIAGNOSIS — Z124 Encounter for screening for malignant neoplasm of cervix: Secondary | ICD-10-CM

## 2019-12-01 DIAGNOSIS — Z8742 Personal history of other diseases of the female genital tract: Secondary | ICD-10-CM

## 2019-12-01 MED ORDER — CETIRIZINE HCL 10 MG PO TABS: 10.0000 mg | ORAL_TABLET | Freq: Every day | ORAL | 11 refills | Status: DC

## 2019-12-01 MED ORDER — FLUTICASONE PROPIONATE 50 MCG/ACT NA SUSP: 2.0000 | Freq: Every day | NASAL | 6 refills | Status: DC

## 2019-12-01 MED ORDER — SYNTHROID 50 MCG PO TABS: ORAL_TABLET | ORAL | 2 refills | Status: DC

## 2019-12-01 MED ORDER — MONTELUKAST SODIUM 10 MG PO TABS: 10.0000 mg | ORAL_TABLET | Freq: Every day | ORAL | 1 refills | Status: DC

## 2019-12-01 NOTE — Assessment & Plan Note (Signed)
Mild asthma without a specific complication plan will be discontinue Symbicort and albuterol use Singulair for now 10 mg at bedtime she also begin Flonase and Zyrtec for antihistamine anti-inflammatory features of her allergic rhinitis and postnasal drainage

## 2019-12-01 NOTE — Progress Notes (Signed)
Headache w/ eye pain x 1 day  Wants ears checked, [place] possible infection in both ears but no med prescribed   [place]/ [place], has Symbicort inhaler but does not use, currently has acute SHOB, uses prescribed Singulair at night as needed

## 2019-12-01 NOTE — Assessment & Plan Note (Signed)
We will assess her thyroid function at this visit she will continue Synthroid 50 mcg daily

## 2019-12-01 NOTE — Patient Instructions (Addendum)
We will schedule you with gynecology for reevaluation of your ovarian cyst and obtain a Pap smear  Labs today will be for thyroid function  Begin Flonase 2 sprays each nostril daily  Begin cetirizine daily as an antihistamine for sinuses  Begin Singulair daily at bedtime  Discontinue further Symbicort  Follow back exercise regimen below  Please consider the Covid vaccine below is information as to how to schedule  Return visit with Dr. Delford Field in 6 weeks   Ejercicios para la espalda Back Exercises Estos ejercicios ayudan a fortalecer el tronco y la espalda. adems, ayudan a mantener la flexibilidad de la zona lumbar. Hacer estos ejercicios puede ser de ayuda para evitar o Engineer, materials de espalda.  Si tiene dolor de espalda, trate de Museum/gallery exhibitions officer ejercicios 2 o 3veces por da, o como se lo haya indicado el mdico.  A medida que [place], haga los ejercicios una vez por da. Repita los ejercicios con ms frecuencia como se lo haya indicado el mdico.  Para evitar que el dolor regrese, haga los ejercicios una vez por da o como se lo haya indicado el mdico. Ejercicios Rodilla al pecho Repita estos pasos 3 o 5veces seguidas con cada pierna: 1. Acustese boca arriba sobre una cama dura o sobre el suelo con las piernas extendidas. 2. Lleve una rodilla al pecho. 3. Agarre la rodilla o el muslo con ambas manos y sostngalo en su lugar. 4. Tire de la rodilla hasta sentir una elongacin suave en la parte baja de la espalda o las nalgas. 5. Mantenga la elongacin durante 10 a 30segundos. 6. Suelte y extienda la pierna lentamente. Inclinacin de la pelvis Repita estos pasos 5 o 10veces seguidas: 1. Acustese boca arriba sobre una cama dura o sobre el suelo con las piernas extendidas. 2. Flexione las rodillas de manera que apunten al techo. Los pies deben estar apoyados en el suelo. 3. Contraiga los msculos de la parte baja del vientre (abdomen) para empujar la zona lumbar contra el  suelo. Este movimiento har que el coxis apunte hacia el techo, en lugar de apuntar hacia abajo en direccin a los pies o al suelo. 4. Mantenga esta posicin durante 5 a 10segundos mientras contrae suavemente los msculos y respira con normalidad. El perro y el gato Repita estos pasos hasta que la zona lumbar se curve con ms facilidad: 1. Apoye las palmas de las manos y las rodillas sobre una superficie firme. Las manos deben estar alineadas con los hombros y las rodillas con las caderas. Puede colocarse almohadillas debajo de las rodillas. 2. Deje que la cabeza cuelgue hacia el pecho. Tense (contraiga) los msculos del vientre. Baje el coxis en direccin al suelo de modo que la zona lumbar se arquee como el lomo de un [place]. 3. Mantenga esta posicin durante 5segundos. 4. [place] lentamente la cabeza. Relaje los msculos del vientre. Eleve el coxis de modo que apunte en direccin al techo para que la espalda forme un arco hundido como el lomo de un perro contento. 5. Mantenga esta posicin durante 5segundos.  Flexiones de brazos Repita estos pasos 5 o 10veces seguidas: 1. Acustese boca abajo en el suelo. 2. Ponga las manos cerca de la cabeza, separadas aproximadamente al ancho de los hombros. 3. Con la espalda relajada y las caderas apoyadas en el suelo, extienda lentamente los brazos para levantar la mitad superior del cuerpo y Optometrist los hombros. No use los msculos de la espalda. Puede cambiar la ubicacin de las manos para  estar ms cmodo. 4. Mantenga esta posicin durante 5segundos. 5. Lentamente vuelva a la posicin horizontal.  Puentes Repita estos pasos 10veces seguidas: 1. Acustese boca arriba sobre una superficie firme. 2. Flexione las rodillas de manera que apunten al techo. Los pies deben estar apoyados en el suelo. Los brazos deben estar paralelos a los costados del cuerpo, cerca del cuerpo. 3. Contraiga los glteos y despegue las nalgas del suelo hasta que la  cintura est casi a la altura de las rodillas. Si no siente el trabajo muscular en las nalgas y la parte posterior de los muslos, aleje los pies 1 o 2pulgadas (2.5 o 5centmetros) de las nalgas. 4. Mantenga esta posicin durante 3 a 5segundos. 5. Lentamente, vuelva a apoyar las nalgas en el suelo y relaje los glteos. Si este ejercicio le resulta muy fcil, intente realizarlo con los brazos cruzados [place]. Abdominales Repita estos pasos 5 o 10veces seguidas: 1. Acustese boca arriba sobre una cama dura o sobre el suelo con las piernas extendidas. 2. Flexione las rodillas de manera que apunten al techo. Los pies deben estar apoyados en el suelo. 3. Cruce los [employer]. 4. Baje levemente el mentn en direccin al pecho, pero no doble el cuello. 5. Contraiga los msculos del abdomen y con lentitud eleve el pecho lo suficiente como para despegar levemente los omplatos del suelo. Evite levantar el cuerpo ms alto que eso, porque puede sobreexigir la zona lumbar. 6. Lentamente baje el pecho y la cabeza hasta el suelo. Elevaciones de espalda Repita estos pasos 5 o 10veces seguidas: 1. Acustese boca abajo con los brazos a los costados y apoye la frente en el suelo. 2. Contraiga los msculos de las piernas y los glteos. 3. Lentamente despegue el pecho del suelo mientras mantiene las caderas apoyadas en el suelo. Mantenga la nuca alineada con la curvatura de la espalda. Mire hacia el suelo mientras hace este ejercicio. 4. Mantenga esta posicin durante 3 a 5segundos. 5. Lentamente baje el pecho y el rostro hasta el suelo. Comunquese con un mdico si:  El dolor de espalda se vuelve mucho ms intenso cuando hace un ejercicio.  El dolor de espalda no mejora 2horas despus de [employer] ejercicios. Si tiene alguno de [employer], deje de [employer] ejercicios. No vuelva a hacer los ejercicios a menos que el mdico lo autorice. Solicite ayuda inmediatamente si:  Siente  un dolor sbito y muy intenso en la espalda. Si esto ocurre, deje de [employer]. No vuelva a hacer los ejercicios a menos que el mdico lo autorice. Esta informacin no tiene Theme park manager el consejo del mdico. Asegrese de hacerle al mdico cualquier pregunta que tenga. Document Revised: 12/03/2017 Document Reviewed: 12/03/2017 Elsevier Patient Education  2020 [employer].

## 2019-12-01 NOTE — Progress Notes (Addendum)
Subjective:    Patient ID: Veronica Jensen, female    DOB: 23-Dec-1975, 44 y.o.   MRN: [id number]  12/01/2019 This is a 44 year old female who is seen to establish for primary care.  This patient is originally from the [place] and the visit today is established with a Spanish language interpreter in person.  Patient is a former patient of [facility] but has not been seen in that clinic since April 2021 she wishes [facility].  Her significant history is that of mild intermittent asthma with associated upper airway complaints.  She complains of postnasal drip ear congestion ear pain left greater than right she complains of a closure sensation in the throat she denies any reflux symptoms she states she will have nocturnal cough denies any wheezing she states she has a difficult time taking breath the end of her lungs but no difficulty breathing air out.  When in [place] over the prior year she was given a prescription for Symbicort 160 and also Singulair she only takes Singulair and on that as needed.  He other history is that of hypothyroidism acquired she is on Synthroid 50 mcg daily she did have an ultrasound of the thyroid previously it was unremarkable.  The patient states she is yet to receive a Covid vaccine she will consider a flu vaccine  Her resistance to Covid vaccine is unclear she cannot fully state why she is just uncertain about it. There are no other specific complaints at this time Patient has history of ovarian cyst and had a myomectomy for uterine leiomyoma and she does need gynecology follow-up The patient does not smoke or drink alcohol  Asthma She complains of cough, frequent throat clearing, hoarse voice and shortness of breath. There is no chest tightness, hemoptysis, sputum production or wheezing. Primary symptoms comments: Cough qhs. This is a chronic problem. The current episode started  more than 1 year ago. The problem occurs intermittently. The cough is non-productive, dry, nocturnal and hoarse. Associated symptoms include ear congestion and headaches. Pertinent negatives include no ear pain, heartburn, malaise/fatigue, myalgias, nasal congestion, postnasal drip, sneezing, sore throat or trouble swallowing. Associated symptoms comments: Ear pain . Her symptoms are aggravated by emotional stress and change in weather. Her past medical history is significant for asthma.   Past Medical History:  Diagnosis Date  . Abdominal pain   . Asthma in adult without complication   . Chest pain   . Chronic neck pain   . Hyperthyroidism   . Language barrier   . Mood changes   . Nausea   . Ovarian cyst   . Shortness of breath   . Varicose veins of both lower extremities      Family History  Problem Relation Age of Onset  . Thyroid disease Mother   . Hypercholesterolemia Father      Social History   Socioeconomic History  . Marital status: Married    Spouse name: Not on file  . Number of children: Not on file  . Years of education: Not on file  . Highest education level: Bachelor's degree (e.g., BA, AB, BS)  Occupational History  . Not on file  Tobacco Use  . Smoking status: Never Smoker  . Smokeless tobacco: Never Used  Vaping Use  . Vaping Use: Never used  Substance and Sexual Activity  . Alcohol use: Not Currently  . Drug use: Not Currently  . Sexual activity: Not Currently  Birth control/protection: None  Other Topics Concern  . Not on file  Social History Narrative  . Not on file   Social Determinants of Health   Financial Resource Strain:   . Difficulty of Paying Living Expenses: Not on file  Food Insecurity:   . Worried About Programme researcher, broadcasting/film/video in the Last Year: Not on file  . Ran Out of Food in the Last Year: Not on file  Transportation Needs: Unmet Transportation Needs  . Lack of Transportation (Medical): Yes  . Lack of Transportation  (Non-Medical): Not on file  Physical Activity:   . Days of Exercise per Week: Not on file  . Minutes of Exercise per Session: Not on file  Stress:   . Feeling of Stress : Not on file  Social Connections:   . Frequency of Communication with Friends and Family: Not on file  . Frequency of Social Gatherings with Friends and Family: Not on file  . Attends Religious Services: Not on file  . Active Member of Clubs or Organizations: Not on file  . Attends Banker Meetings: Not on file  . Marital Status: Not on file  Intimate Partner Violence:   . Fear of Current or Ex-Partner: Not on file  . Emotionally Abused: Not on file  . Physically Abused: Not on file  . Sexually Abused: Not on file     Allergies  Allergen Reactions  . Anesthesia S-I-40 [Propofol] Other (See Comments)    Hypotension     Outpatient Medications Prior to Visit  Medication Sig Dispense Refill  . montelukast (SINGULAIR) 10 MG tablet Take 10 mg by mouth at bedtime.    Marland Kitchen SYNTHROID 50 MCG tablet TAKE 1 TABLET (50 MCG TOTAL) BY MOUTH DAILY BEFORE BREAKFAST. 30 tablet 6  . albuterol (VENTOLIN HFA) 108 (90 Base) MCG/ACT inhaler Inhale 2 puffs into the lungs every 6 (six) hours as needed for wheezing or shortness of breath. (Patient not taking: Reported on 06/01/2019) 18 g 11  . budesonide-formoterol (SYMBICORT) 160-4.5 MCG/ACT inhaler Inhale 2 puffs into the lungs 2 (two) times daily. (Patient not taking: Reported on 12/01/2019)    . naproxen (NAPROSYN) 500 MG tablet Take 1 tablet (500 mg total) by mouth 2 (two) times daily. (Patient not taking: Reported on 06/01/2019) 30 tablet 0   No facility-administered medications prior to visit.      Review of Systems  Constitutional: Negative.  Negative for malaise/fatigue.  HENT: Positive for hoarse voice. Negative for ear pain, postnasal drip, sneezing, sore throat and trouble swallowing.   Eyes: Positive for visual disturbance.  Respiratory: Positive for cough and  shortness of breath. Negative for hemoptysis, sputum production and wheezing.   Gastrointestinal: Negative.  Negative for heartburn.  Genitourinary: Negative.   Musculoskeletal: Negative for myalgias.  Neurological: Positive for headaches. Negative for dizziness, light-headedness and numbness.  Psychiatric/Behavioral: Negative for dysphoric mood, self-injury, sleep disturbance and suicidal ideas. The patient is not nervous/anxious.        Objective:   Physical Exam Vitals:   12/01/19 0907  BP: 110/75  Pulse: 65  Resp: 16  Temp: 97.7 F (36.5 C)  TempSrc: Temporal  SpO2: 100%  Weight: 138 lb (62.6 kg)  Height: 5' 2.11" (1.578 m)    Gen: Pleasant, well-nourished, in no distress,  normal affect  ENT: No lesions,  mouth clear,  oropharynx clear, 3+ postnasal drip, turbinate edema nasal inflammation without purulence both ears are clear  Neck: No JVD, no TMG, no carotid bruits  Lungs: No use of accessory muscles, no dullness to percussion, clear without rales or rhonchi  Cardiovascular: RRR, heart sounds normal, no murmur or gallops, no peripheral edema  Abdomen: soft and NT, no HSM,  BS normal  Musculoskeletal: No deformities, no cyanosis or clubbing  Neuro: alert, non focal  Skin: Warm, no lesions or rashes  No results found.        Assessment & Plan:  I personally reviewed all images and lab data in the [facility] system as well as any outside material available during this office visit and agree with the  radiology impressions.   Mild asthma without complication Mild asthma without a specific complication plan will be discontinue Symbicort and albuterol use Singulair for now 10 mg at bedtime she also begin Flonase and Zyrtec for antihistamine anti-inflammatory features of her allergic rhinitis and postnasal drainage    Hypothyroidism We will assess her thyroid function at this visit she will continue Synthroid 50 mcg daily  History of ovarian cyst History of  ovarian cyst and myomectomy of the uterus we will schedule a gynecology reevaluation and obtain for this patient a Pap smear   Veronica Jensen was seen today for establish care.  Diagnoses and all orders for this visit:  History of ovarian cyst -     Ambulatory referral to Gynecology  Hypothyroidism, unspecified type -     SYNTHROID 50 MCG tablet; TAKE 1 TABLET (50 MCG TOTAL) BY MOUTH DAILY BEFORE BREAKFAST. -     Thyroid Panel With TSH  Cervical cancer screening -     Ambulatory referral to Gynecology  Mild intermittent asthma without complication  Other orders -     montelukast (SINGULAIR) 10 MG tablet; Take 1 tablet (10 mg total) by mouth at bedtime. -     fluticasone (FLONASE) 50 MCG/ACT nasal spray; Place 2 sprays into both nostrils daily. -     cetirizine (ZYRTEC) 10 MG tablet; Take 1 tablet (10 mg total) by mouth daily.   Note I spent 10 minutes going over the importance in need of the Covid vaccine with this patient I went over all the typical miss of the vaccine she took this under advisement she is still not certain she will receive the vaccine but she says she will think further about it I told her we would follow-up on this at the next visit

## 2019-12-01 NOTE — Assessment & Plan Note (Signed)
History of ovarian cyst and myomectomy of the uterus we will schedule a gynecology reevaluation and obtain for this patient a Pap smear

## 2019-12-02 ENCOUNTER — Ambulatory Visit: Payer: Self-pay | Admitting: Family Medicine

## 2019-12-02 LAB — THYROID PANEL WITH TSH
Free Thyroxine Index: 2.1 (ref 1.2–4.9)
T3 Uptake Ratio: 26 % (ref 24–39)
T4, Total: 7.9 ug/dL (ref 4.5–12.0)
TSH: 1.59 u[IU]/mL (ref 0.450–4.500)

## 2019-12-05 NOTE — Progress Notes (Signed)
Spanish Normal result letter generated and mailed to address on file. 

## 2020-01-03 ENCOUNTER — Other Ambulatory Visit: Payer: Self-pay | Admitting: Critical Care Medicine

## 2020-01-04 ENCOUNTER — Other Ambulatory Visit: Payer: Self-pay | Admitting: Obstetrics and Gynecology

## 2020-01-04 DIAGNOSIS — Z1231 Encounter for screening mammogram for malignant neoplasm of breast: Secondary | ICD-10-CM

## 2020-01-18 ENCOUNTER — Other Ambulatory Visit: Payer: Self-pay

## 2020-01-18 ENCOUNTER — Ambulatory Visit: Payer: Self-pay | Attending: Critical Care Medicine | Admitting: Critical Care Medicine

## 2020-01-18 ENCOUNTER — Encounter: Payer: Self-pay | Admitting: Critical Care Medicine

## 2020-01-18 ENCOUNTER — Other Ambulatory Visit: Payer: Self-pay | Admitting: Critical Care Medicine

## 2020-01-18 VITALS — BP 102/67 | HR 64 | Temp 98.2°F | Wt 135.8 lb

## 2020-01-18 DIAGNOSIS — Z131 Encounter for screening for diabetes mellitus: Secondary | ICD-10-CM

## 2020-01-18 DIAGNOSIS — E039 Hypothyroidism, unspecified: Secondary | ICD-10-CM

## 2020-01-18 DIAGNOSIS — E559 Vitamin D deficiency, unspecified: Secondary | ICD-10-CM

## 2020-01-18 DIAGNOSIS — Z13220 Encounter for screening for lipoid disorders: Secondary | ICD-10-CM

## 2020-01-18 DIAGNOSIS — R5383 Other fatigue: Secondary | ICD-10-CM

## 2020-01-18 DIAGNOSIS — Z124 Encounter for screening for malignant neoplasm of cervix: Secondary | ICD-10-CM

## 2020-01-18 DIAGNOSIS — H6523 Chronic serous otitis media, bilateral: Secondary | ICD-10-CM

## 2020-01-18 DIAGNOSIS — Z8742 Personal history of other diseases of the female genital tract: Secondary | ICD-10-CM

## 2020-01-18 DIAGNOSIS — J4520 Mild intermittent asthma, uncomplicated: Secondary | ICD-10-CM

## 2020-01-18 MED ORDER — AFRIN NASAL SPRAY 0.05 % NA SOLN: 1.0000 | Freq: Two times a day (BID) | NASAL | 0 refills | Status: DC

## 2020-01-18 MED ORDER — PREDNISONE 10 MG PO TABS: ORAL_TABLET | ORAL | 0 refills | Status: DC

## 2020-01-18 NOTE — Progress Notes (Signed)
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

## 2020-01-18 NOTE — Assessment & Plan Note (Signed)
Bilateral serous otitis media with flare Patient told to resume Flonase will give course of prednisone and given Afrin for travel in airplanes

## 2020-01-18 NOTE — Progress Notes (Signed)
Subjective:    Patient ID: Veronica Jensen, female    DOB: [birth year], 44 y.o.   MRN: [id number]  12/01/2019 This is a 44 year old female who is seen to establish for primary care.  This patient is originally from the [place] and the visit today is established with a Spanish language interpreter in person.  Patient is a former patient of [facility] but has not been seen in that clinic since April 2021 she wishes [facility].  Her significant history is that of mild intermittent asthma with associated upper airway complaints.  She complains of postnasal drip ear congestion ear pain left greater than right she complains of a closure sensation in the throat she denies any reflux symptoms she states she will have nocturnal cough denies any wheezing she states she has a difficult time taking breath the end of her lungs but no difficulty breathing air out.  When in [place] over the prior year she was given a prescription for Symbicort 160 and also Singulair she only takes Singulair and on that as needed.  He other history is that of hypothyroidism acquired she is on Synthroid 50 mcg daily she did have an ultrasound of the thyroid previously it was unremarkable.  The patient states she is yet to receive a Covid vaccine she will consider a flu vaccine  Her resistance to Covid vaccine is unclear she cannot fully state why she is just uncertain about it. There are no other specific complaints at this time Patient has history of ovarian cyst and had a myomectomy for uterine leiomyoma and she does need gynecology follow-up The patient does not smoke or drink alcohol  01/18/2020 Patient returns for primary care.  She has a history of asthma and now complains of bilateral ear pain worse at altitude.  She continues to have allergic rhinitis type symptoms and has not been compliant with her antihistamine or Flonase.  She wishes  to have her vitamin D levels rechecked.  She has been taking oral vitamin D and is questioning whether she needs prescription strength vitamin D.  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  She is yet to receive the Covid vaccine.  Patient maintains Synthroid 50 mcg daily with the last visit thyroid function was adequate on this   Asthma She complains of shortness of breath. There is no chest tightness, cough, frequent throat clearing, hemoptysis, hoarse voice, sputum production or wheezing. This is a chronic problem. The current episode started more than 1 year ago. The problem occurs rarely. The problem has been gradually improving. The cough is non-productive, dry, nocturnal and hoarse. Associated symptoms include ear congestion, ear pain, headaches, postnasal drip, rhinorrhea, sneezing and a sore throat. Pertinent negatives include no heartburn, malaise/fatigue, myalgias, nasal congestion or trouble swallowing. Associated symptoms comments: Ear pain . Her symptoms are aggravated by emotional stress and change in weather. She reports moderate improvement on treatment. Ineffective treatments: singulair, flonase. Her past medical history is significant for asthma.   Past Medical History:  Diagnosis Date  . Abdominal pain   . Asthma in adult without complication   . Chest pain   . Chronic neck pain   . Hyperthyroidism   . Language barrier   . Mood changes   . Nausea   . Ovarian cyst   . Shortness of breath   . Varicose veins of both lower extremities  Family History  Problem Relation Age of Onset  . Thyroid disease Mother   . Hypercholesterolemia Father      Social History   Socioeconomic History  . Marital status: Married    Spouse name: Not on file  . Number of children: Not on file  . Years of education: Not on file  . Highest education level: Bachelor's degree (e.g., BA, AB, BS)  Occupational History  . Not on file  Tobacco  Use  . Smoking status: Never Smoker  . Smokeless tobacco: Never Used  Vaping Use  . Vaping Use: Never used  Substance and Sexual Activity  . Alcohol use: Not Currently  . Drug use: Not Currently  . Sexual activity: Not Currently    Birth control/protection: None  Other Topics Concern  . Not on file  Social History Narrative  . Not on file   Social Determinants of Health   Financial Resource Strain:   . Difficulty of Paying Living Expenses: Not on file  Food Insecurity:   . Worried About Programme researcher, broadcasting/film/video in the Last Year: Not on file  . Ran Out of Food in the Last Year: Not on file  Transportation Needs: Unmet Transportation Needs  . Lack of Transportation (Medical): Yes  . Lack of Transportation (Non-Medical): Not on file  Physical Activity:   . Days of Exercise per Week: Not on file  . Minutes of Exercise per Session: Not on file  Stress:   . Feeling of Stress : Not on file  Social Connections:   . Frequency of Communication with Friends and Family: Not on file  . Frequency of Social Gatherings with Friends and Family: Not on file  . Attends Religious Services: Not on file  . Active Member of Clubs or Organizations: Not on file  . Attends Banker Meetings: Not on file  . Marital Status: Not on file  Intimate Partner Violence:   . Fear of Current or Ex-Partner: Not on file  . Emotionally Abused: Not on file  . Physically Abused: Not on file  . Sexually Abused: Not on file     Allergies  Allergen Reactions  . Anesthesia S-I-40 [Propofol] Other (See Comments)    Hypotension     Outpatient Medications Prior to Visit  Medication Sig Dispense Refill  . cetirizine (ZYRTEC) 10 MG tablet Take 1 tablet (10 mg total) by mouth daily. 30 tablet 11  . fluticasone (FLONASE) 50 MCG/ACT nasal spray Place 2 sprays into both nostrils daily. 16 g 6  . montelukast (SINGULAIR) 10 MG tablet Take 1 tablet (10 mg total) by mouth at bedtime. 90 tablet 1  . SYNTHROID 50  MCG tablet TAKE 1 TABLET (50 MCG TOTAL) BY MOUTH DAILY BEFORE BREAKFAST. 90 tablet 2   No facility-administered medications prior to visit.      Review of Systems  Constitutional: Negative.  Negative for malaise/fatigue.  HENT: Positive for ear pain, postnasal drip, rhinorrhea, sneezing and sore throat. Negative for hoarse voice and trouble swallowing.   Eyes: Positive for visual disturbance.  Respiratory: Positive for shortness of breath. Negative for cough, hemoptysis, sputum production and wheezing.   Gastrointestinal: Negative.  Negative for heartburn.  Genitourinary: Negative.   Musculoskeletal: Negative for myalgias.  Neurological: Positive for headaches. Negative for dizziness, light-headedness and numbness.  Psychiatric/Behavioral: Negative for dysphoric mood, self-injury, sleep disturbance and suicidal ideas. The patient is not nervous/anxious.        Objective:   Physical Exam Vitals:   01/18/20  0922  BP: 102/67  Pulse: 64  Temp: 98.2 F (36.8 C)  SpO2: 100%  Weight: 135 lb 12.8 oz (61.6 kg)    Gen: Pleasant, well-nourished, in no distress,  normal affect  ENT: No lesions,  mouth clear,  oropharynx clear, 3+ postnasal drip, turbinate edema nasal inflammation without purulence both ears show air-fluid levels in the middle ears  Neck: No JVD, no TMG, no carotid bruits  Lungs: No use of accessory muscles, no dullness to percussion, clear without rales or rhonchi  Cardiovascular: RRR, heart sounds normal, no murmur or gallops, no peripheral edema  Abdomen: soft and NT, no HSM,  BS normal  Musculoskeletal: No deformities, no cyanosis or clubbing  Neuro: alert, non focal  Skin: Warm, no lesions or rashes  No results found.        Assessment & Plan:  I personally reviewed all images and lab data in the [facility] system as well as any outside material available during this office visit and agree with the  radiology impressions.   Mild asthma without  complication Mild asthma with allergic rhinitis again I went over the importance of maintaining Flonase antihistamine therapy and Singulair  Medications were refilled  Bilateral chronic serous otitis media Bilateral serous otitis media with flare Patient told to resume Flonase will give course of prednisone and given Afrin for travel in airplanes  Cervical cancer screening Needs referral for cervical cancer screening will send to gynecology given history of myomectomy and ovarian pain and cysts  Hypothyroidism Maintain current dose of Synthroid  Vitamin D deficiency Check vitamin D levels   Frida was seen today for follow-up.  Diagnoses and all orders for this visit:  Bilateral chronic serous otitis media  Vitamin D deficiency -     VITAMIN D 25 Hydroxy (Vit-D Deficiency, Fractures)  Diabetes mellitus screening -     Comprehensive metabolic panel  Lipid screening -     Cancel: Lipid panel  Fatigue, unspecified type -     Cancel: CBC with Differential/Platelet  History of ovarian cyst -     Ambulatory referral to Gynecology  Cervical cancer screening -     Ambulatory referral to Gynecology  Mild intermittent asthma without complication  Hypothyroidism, unspecified type  Other orders -     oxymetazoline (AFRIN NASAL SPRAY) 0.05 % nasal spray; Place 1 spray into both nostrils 2 (two) times daily. For two days prior to air travel and day of air travel -     predniSONE (DELTASONE) 10 MG tablet; Take 4 tablets daily for 5 days then stop  I spent additional time with this patient going over the importance of Covid vaccine she again refuses to receive 1 she is giving it further consideration however

## 2020-01-18 NOTE — Assessment & Plan Note (Signed)
Mild asthma with allergic rhinitis again I went over the importance of maintaining Flonase antihistamine therapy and Singulair  Medications were refilled

## 2020-01-18 NOTE — Assessment & Plan Note (Signed)
Needs referral for cervical cancer screening will send to gynecology given history of myomectomy and ovarian pain and cysts

## 2020-01-18 NOTE — Assessment & Plan Note (Signed)
Maintain current dose of Synthroid

## 2020-01-18 NOTE — Assessment & Plan Note (Signed)
Check vitamin D levels 

## 2020-01-18 NOTE — Patient Instructions (Addendum)
Take prednisone for nasal inflammation  No other medication changes  Please get covid vaccine  Referral to gynecology for pap smear  A vitamin D level will be obtained  Use Afrin nasal spray 1 spray ea nostril twice a day each nostril two days before air travel and day of air travel  Take a vitamin D supplement 1000 units daily  Return Dr Delford Field 2 months  Back exercises as below and do yoga   Ejercicios para la espalda Back Exercises Estos ejercicios ayudan a fortalecer el tronco y la espalda. adems, ayudan a mantener la flexibilidad de la zona lumbar. Hacer estos ejercicios puede ser de ayuda para evitar o Engineer, materials de espalda.  Si tiene dolor de espalda, trate de Museum/gallery exhibitions officer ejercicios 2 o 3veces por da, o como se lo haya indicado el mdico.  A medida que [place], haga los ejercicios una vez por da. Repita los ejercicios con ms frecuencia como se lo haya indicado el mdico.  Para evitar que el dolor regrese, haga los ejercicios una vez por da o como se lo haya indicado el mdico. Ejercicios Rodilla al pecho Repita estos pasos 3 o 5veces seguidas con cada pierna: 1. Acustese boca arriba sobre una cama dura o sobre el suelo con las piernas extendidas. 2. Lleve una rodilla al pecho. 3. Agarre la rodilla o el muslo con ambas manos y sostngalo en su lugar. 4. Tire de la rodilla hasta sentir una elongacin suave en la parte baja de la espalda o las nalgas. 5. Mantenga la elongacin durante 10 a 30segundos. 6. Suelte y extienda la pierna lentamente. Inclinacin de la pelvis Repita estos pasos 5 o 10veces seguidas: 1. Acustese boca arriba sobre una cama dura o sobre el suelo con las piernas extendidas. 2. Flexione las rodillas de manera que apunten al techo. Los pies deben estar apoyados en el suelo. 3. Contraiga los msculos de la parte baja del vientre (abdomen) para empujar la zona lumbar contra el suelo. Este movimiento har que el coxis apunte hacia el techo,  en lugar de apuntar hacia abajo en direccin a los pies o al suelo. 4. Mantenga esta posicin durante 5 a 10segundos mientras contrae suavemente los msculos y respira con normalidad. El perro y el gato Repita estos pasos hasta que la zona lumbar se curve con ms facilidad: 1. Apoye las palmas de las manos y las rodillas sobre una superficie firme. Las manos deben estar alineadas con los hombros y las rodillas con las caderas. Puede colocarse almohadillas debajo de las rodillas. 2. Deje que la cabeza cuelgue hacia el pecho. Tense (contraiga) los msculos del vientre. Baje el coxis en direccin al suelo de modo que la zona lumbar se arquee como el lomo de un [place]. 3. Mantenga esta posicin durante 5segundos. 4. Levante lentamente la cabeza. Relaje los msculos del vientre. Eleve el coxis de modo que apunte en direccin al techo para que la espalda forme un arco hundido como el lomo de un perro contento. 5. Mantenga esta posicin durante 5segundos.  Flexiones de brazos Repita estos pasos 5 o 10veces seguidas: 1. Acustese boca abajo en el suelo. 2. Ponga las manos cerca de la cabeza, separadas aproximadamente al ancho de los hombros. 3. Con la espalda relajada y las caderas apoyadas en el suelo, extienda lentamente los brazos para levantar la mitad superior del cuerpo y Optometrist los hombros. No use los msculos de la espalda. Puede cambiar la ubicacin de las manos para estar ms cmodo. 4.  Mantenga esta posicin durante 5segundos. 5. Lentamente vuelva a la posicin horizontal.  Puentes Repita estos pasos 10veces seguidas: 1. Acustese boca arriba sobre una superficie firme. 2. Flexione las rodillas de manera que apunten al techo. Los pies deben estar apoyados en el suelo. Los brazos deben estar paralelos a los costados del cuerpo, cerca del cuerpo. 3. Contraiga los glteos y despegue las nalgas del suelo hasta que la cintura est casi a la altura de las rodillas. Si no siente el  trabajo muscular en las nalgas y la parte posterior de los muslos, aleje los pies 1 o 2pulgadas (2.5 o 5centmetros) de las nalgas. 4. Mantenga esta posicin durante 3 a 5segundos. 5. Lentamente, vuelva a apoyar las nalgas en el suelo y relaje los glteos. Si este ejercicio le resulta muy fcil, intente realizarlo con los brazos cruzados [place]. Abdominales Repita estos pasos 5 o 10veces seguidas: 1. Acustese boca arriba sobre una cama dura o sobre el suelo con las piernas extendidas. 2. Flexione las rodillas de manera que apunten al techo. Los pies deben estar apoyados en el suelo. 3. Cruce los [employer]. 4. Baje levemente el mentn en direccin al pecho, pero no doble el cuello. 5. Contraiga los msculos del abdomen y con lentitud eleve el pecho lo suficiente como para despegar levemente los omplatos del suelo. Evite levantar el cuerpo ms alto que eso, porque puede sobreexigir la zona lumbar. 6. Lentamente baje el pecho y la cabeza hasta el suelo. Elevaciones de espalda Repita estos pasos 5 o 10veces seguidas: 1. Acustese boca abajo con los brazos a los costados y apoye la frente en el suelo. 2. Contraiga los msculos de las piernas y los glteos. 3. Lentamente despegue el pecho del suelo mientras mantiene las caderas apoyadas en el suelo. Mantenga la nuca alineada con la curvatura de la espalda. Mire hacia el suelo mientras hace este ejercicio. 4. Mantenga esta posicin durante 3 a 5segundos. 5. Lentamente baje el pecho y el rostro hasta el suelo. Comunquese con un mdico si:  El dolor de espalda se vuelve mucho ms intenso cuando hace un ejercicio.  El dolor de espalda no mejora 2horas despus de [employer] ejercicios. Si tiene alguno de [employer], deje de [employer] ejercicios. No vuelva a hacer los ejercicios a menos que el mdico lo autorice. Solicite ayuda inmediatamente si:  Siente un dolor sbito y muy intenso en la espalda. Si esto ocurre,  deje de [employer]. No vuelva a hacer los ejercicios a menos que el mdico lo autorice. Esta informacin no tiene Theme park manager el consejo del mdico. Asegrese de hacerle al mdico cualquier pregunta que tenga. Document Revised: 12/03/2017 Document Reviewed: 12/03/2017 Elsevier Patient Education  2020 [employer].

## 2020-01-19 LAB — COMPREHENSIVE METABOLIC PANEL
ALT: 11 IU/L (ref 0–32)
AST: 16 IU/L (ref 0–40)
Albumin/Globulin Ratio: 1.5 (ref 1.2–2.2)
Albumin: 4.3 g/dL (ref 3.8–4.8)
Alkaline Phosphatase: 67 IU/L (ref 44–121)
BUN/Creatinine Ratio: 13 (ref 9–23)
BUN: 12 mg/dL (ref 6–24)
Bilirubin Total: 0.7 mg/dL (ref 0.0–1.2)
CO2: 22 mmol/L (ref 20–29)
Calcium: 9.6 mg/dL (ref 8.7–10.2)
Chloride: 105 mmol/L (ref 96–106)
Creatinine, Ser: 0.91 mg/dL (ref 0.57–1.00)
GFR calc Af Amer: 88 mL/min/{1.73_m2} (ref >59)
GFR calc non Af Amer: 76 mL/min/{1.73_m2} (ref >59)
Globulin, Total: 2.9 g/dL (ref 1.5–4.5)
Glucose: 90 mg/dL (ref 65–99)
Potassium: 4.7 mmol/L (ref 3.5–5.2)
Sodium: 142 mmol/L (ref 134–144)
Total Protein: 7.2 g/dL (ref 6.0–8.5)

## 2020-01-19 LAB — VITAMIN D 25 HYDROXY (VIT D DEFICIENCY, FRACTURES): Vit D, 25-Hydroxy: 33.4 ng/mL (ref 30.0–100.0)

## 2020-01-24 ENCOUNTER — Emergency Department (HOSPITAL_COMMUNITY)
Admission: EM | Admit: 2020-01-24 | Discharge: 2020-01-25 | Disposition: A | Discharge status: Home / Self Care | Payer: No Typology Code available for payment source | Attending: Emergency Medicine | Admitting: Emergency Medicine

## 2020-01-24 ENCOUNTER — Encounter (HOSPITAL_COMMUNITY): Payer: Self-pay | Admitting: *Deleted

## 2020-01-24 ENCOUNTER — Other Ambulatory Visit: Payer: Self-pay

## 2020-01-24 ENCOUNTER — Ambulatory Visit: Payer: No Typology Code available for payment source

## 2020-01-24 DIAGNOSIS — H7292 Unspecified perforation of tympanic membrane, left ear: Secondary | ICD-10-CM | POA: Insufficient documentation

## 2020-01-24 DIAGNOSIS — Z79899 Other long term (current) drug therapy: Secondary | ICD-10-CM | POA: Insufficient documentation

## 2020-01-24 DIAGNOSIS — H6692 Otitis media, unspecified, left ear: Secondary | ICD-10-CM | POA: Insufficient documentation

## 2020-01-24 DIAGNOSIS — H6690 Otitis media, unspecified, unspecified ear: Secondary | ICD-10-CM

## 2020-01-24 DIAGNOSIS — J4530 Mild persistent asthma, uncomplicated: Secondary | ICD-10-CM | POA: Insufficient documentation

## 2020-01-24 DIAGNOSIS — E039 Hypothyroidism, unspecified: Secondary | ICD-10-CM | POA: Insufficient documentation

## 2020-01-24 NOTE — ED Triage Notes (Signed)
Left ear pain since last night. No fevers.

## 2020-01-25 ENCOUNTER — Other Ambulatory Visit: Payer: Self-pay | Admitting: Critical Care Medicine

## 2020-01-25 MED ORDER — FEXOFENADINE-PSEUDOEPHED ER 60-120 MG PO TB12: 1.0000 | ORAL_TABLET | Freq: Two times a day (BID) | ORAL | 0 refills | Status: DC

## 2020-01-25 MED ORDER — IBUPROFEN 400 MG PO TABS
400.0000 mg | ORAL_TABLET | Freq: Once | ORAL | Status: AC | PRN
  Administered 2020-01-25: 400 mg via ORAL
  Filled 2020-01-25: qty 1

## 2020-01-25 MED ORDER — AZITHROMYCIN 250 MG PO TABS: 250.0000 mg | ORAL_TABLET | Freq: Every day | ORAL | 0 refills | Status: DC

## 2020-01-25 NOTE — ED Provider Notes (Signed)
[facility] EMERGENCY DEPARTMENT Provider Note   CSN: [id number] Arrival date & time: 01/24/20  2134     History Chief Complaint  Patient presents with  . Otalgia    Veronica Jensen is a 44 y.o. female.  Patient presents to the emergency department for evaluation of left ear pain.  Patient has been having pain for more than a week.  She was seen by primary care 1 week ago and was prescribed Flonase, prednisone, Afrin.  She did not use any of these.  Patient reports she had increased pain last night in the left ear.  It is a sharp pain in the ear that radiates down into the left side of her throat.        Past Medical History:  Diagnosis Date  . Abdominal pain   . Asthma in adult without complication   . Chest pain   . Chronic neck pain   . Hyperthyroidism   . Language barrier   . Mood changes   . Nausea   . Ovarian cyst   . Shortness of breath   . Varicose veins of both lower extremities     Patient Active Problem List   Diagnosis Date Noted  . Bilateral chronic serous otitis media 01/18/2020  . Cervical cancer screening 01/18/2020  . Vitamin D deficiency 01/18/2020  . Mild asthma without complication 06/02/2019  . History of ovarian cyst 07/15/2018  . Varicose veins of bilateral lower extremities with pain 07/15/2018  . Chronic neck pain 07/15/2018  . Hypothyroidism 06/08/2018  . Chest pain 06/08/2018    Past Surgical History:  Procedure Laterality Date  . MYOMECTOMY       OB History    Gravida  0   Para  0   Term  0   Preterm  0   AB  0   Living  0     SAB  0   TAB  0   Ectopic  0   Multiple  0   Live Births  0           Family History  Problem Relation Age of Onset  . Thyroid disease Mother   . Hypercholesterolemia Father     Social History   Tobacco Use  . Smoking status: Never Smoker  . Smokeless tobacco: Never Used  Vaping Use  . Vaping Use: Never used  Substance Use Topics  . Alcohol  use: Not Currently  . Drug use: Not Currently    Home Medications Prior to Admission medications   Medication Sig Start Date End Date Taking? Authorizing Provider  azithromycin (ZITHROMAX) 250 MG tablet Take 1 tablet (250 mg total) by mouth daily. Take first 2 tablets together, then 1 every day until finished. 01/25/20   Gilda Crease, MD  fexofenadine-pseudoephedrine (ALLEGRA-D) 60-120 MG 12 hr tablet Take 1 tablet by mouth every 12 (twelve) hours. Make sure to take before boarding airplane 01/25/20   Gilda Crease, MD  fluticasone [facility]) 50 MCG/ACT nasal spray Place 2 sprays into both nostrils daily. 12/01/19   Storm Frisk, MD  montelukast (SINGULAIR) 10 MG tablet Take 1 tablet (10 mg total) by mouth at bedtime. 12/01/19   Storm Frisk, MD  oxymetazoline (AFRIN NASAL SPRAY) 0.05 % nasal spray Place 1 spray into both nostrils 2 (two) times daily. For two days prior to air travel and day of air travel 01/18/20   Storm Frisk, MD  predniSONE (DELTASONE) 10 MG tablet Take  4 tablets daily for 5 days then stop 01/18/20   Storm Frisk, MD  SYNTHROID 50 MCG tablet TAKE 1 TABLET (50 MCG TOTAL) BY MOUTH DAILY BEFORE BREAKFAST. 12/01/19   Storm Frisk, MD  nitroGLYCERIN (NITROSTAT) 0.4 MG SL tablet Place 1 tablet (0.4 mg total) under the tongue every 5 (five) minutes as needed for chest pain. Patient not taking: Reported on 04/27/2019 06/08/18 05/23/19  Kallie Locks, FNP    Allergies    Anesthesia s-i-40 [propofol]  Review of Systems   Review of Systems  HENT: Positive for ear pain.   All other systems reviewed and are negative.   Physical Exam Updated Vital Signs BP 104/66 (BP Location: Left Arm)   Pulse 64   Temp 98.2 F (36.8 C) (Oral)   Resp 18   SpO2 100%   Physical Exam Vitals and nursing note reviewed.  Constitutional:      General: She is not in acute distress.    Appearance: Normal appearance. She is well-developed.  HENT:      Head: Normocephalic and atraumatic.     Right Ear: Hearing normal.     Left Ear: Hearing normal. Tympanic membrane is injected.     Nose: Nose normal.  Eyes:     Conjunctiva/sclera: Conjunctivae normal.     Pupils: Pupils are equal, round, and reactive to light.  Cardiovascular:     Rate and Rhythm: Regular rhythm.     Heart sounds: S1 normal and S2 normal. No murmur heard.  No friction rub. No gallop.   Pulmonary:     Effort: Pulmonary effort is normal. No respiratory distress.     Breath sounds: Normal breath sounds.  Chest:     Chest wall: No tenderness.  Abdominal:     General: Bowel sounds are normal.     Palpations: Abdomen is soft.     Tenderness: There is no abdominal tenderness. There is no guarding or rebound. Negative signs include Murphy's sign and McBurney's sign.     Hernia: No hernia is present.  Musculoskeletal:        General: Normal range of motion.     Cervical back: Normal range of motion and neck supple.  Skin:    General: Skin is warm and dry.     Findings: No rash.  Neurological:     Mental Status: She is alert and oriented to person, place, and time.     GCS: GCS eye subscore is 4. GCS verbal subscore is 5. GCS motor subscore is 6.     Cranial Nerves: No cranial nerve deficit.     Sensory: No sensory deficit.     Coordination: Coordination normal.  Psychiatric:        Speech: Speech normal.        Behavior: Behavior normal.        Thought Content: Thought content normal.     ED Results / Procedures / Treatments   Labs (all labs ordered are listed, but only abnormal results are displayed) Labs Reviewed - No data to display  EKG None  Radiology No results found.  Procedures Procedures (including critical care time)  Medications Ordered in ED Medications  ibuprofen (ADVIL) tablet 400 mg (400 mg Oral Given 01/25/20 0442)    ED Course  I have reviewed the triage vital signs and the nursing notes.  Pertinent labs & imaging results that  were available during my care of the patient were reviewed by me and considered in my medical  decision making (see chart for details).    MDM Rules/Calculators/A&P                          Patient does have some injection and erythema of the left tympanic membrane without any bulging or obvious pus behind the membrane.  Patient very concerned because she is planning a long distance flight in 10 days and has had problems with her ears during flights.  Final Clinical Impression(s) / ED Diagnoses Final diagnoses:  Acute otitis media, unspecified otitis media type    Rx / DC Orders ED Discharge Orders         Ordered    azithromycin (ZITHROMAX) 250 MG tablet  Daily        01/25/20 0705    fexofenadine-pseudoephedrine (ALLEGRA-D) 60-120 MG 12 hr tablet  Every 12 hours        01/25/20 0705           Gilda Crease, MD 01/25/20 [phone]

## 2020-01-25 NOTE — ED Notes (Signed)
Pt complaining of pain progressing down to her throat as well as across her head. States " it's like a needle poking through my ear"

## 2020-01-25 NOTE — Telephone Encounter (Signed)
Requested medication (s) are due for refill today: no  Requested medication (s) are on the active medication list: yes   Last refill:  01/18/2020  Future visit scheduled: yes   Notes to clinic:  this refill cannot be delegated    Requested Prescriptions  Pending Prescriptions Disp Refills   predniSONE (DELTASONE) 10 MG tablet [Pharmacy Med Name: predniSONE 10 MG TABS 10 Tablet] 20 tablet 0    Sig: Take 4 tablets daily for 5 days then stop      Not Delegated - Endocrinology:  Oral Corticosteroids Failed - 01/25/2020 12:04 PM      Failed - This refill cannot be delegated      Passed - Last BP in normal range    BP Readings from Last 1 Encounters:  01/25/20 (!) 94/56          Passed - Valid encounter within last 6 months    Recent Outpatient Visits           1 week ago Bilateral chronic serous otitis media   [facility] Storm Frisk, MD   1 month ago Hypothyroidism, unspecified type   [facility] Storm Frisk, MD       Future Appointments             In 1 month Delford Field Charlcie Cradle, MD [facility]

## 2020-02-07 ENCOUNTER — Ambulatory Visit: Payer: Self-pay | Admitting: *Deleted

## 2020-02-07 ENCOUNTER — Ambulatory Visit
Admission: RE | Admit: 2020-02-07 | Discharge: 2020-02-07 | Disposition: A | Discharge status: Home / Self Care | Payer: No Typology Code available for payment source | Source: Ambulatory Visit | Attending: Obstetrics and Gynecology | Admitting: Obstetrics and Gynecology

## 2020-02-07 ENCOUNTER — Other Ambulatory Visit: Payer: Self-pay

## 2020-02-07 VITALS — BP 118/74 | Wt 135.7 lb

## 2020-02-07 DIAGNOSIS — Z1239 Encounter for other screening for malignant neoplasm of breast: Secondary | ICD-10-CM

## 2020-02-07 DIAGNOSIS — Z1231 Encounter for screening mammogram for malignant neoplasm of breast: Secondary | ICD-10-CM

## 2020-02-07 NOTE — Patient Instructions (Signed)
Explained breast self awareness with [facility] [name]. Patient did not need a Pap smear today due to last Pap smear was in November 2019 per patient. Let her know [facility]CP will cover Pap smears every 3 years unless has a history of abnormal Pap smears. Referred patient to the [facility] for a screening mammogram on the mobile unit. Appointment scheduled Tuesday, February 07, 2020 at 0920. Patient escorted to mobile unit following B[facility]CP appointment for her screening mammogram. Let patient know the Breast Center will follow up with her within the next couple weeks with results of her mammogram by letter or phone. [employer] [name] verbalized understanding.  Shalyn Koral, Kathaleen Maser, RN 8:25 AM

## 2020-02-07 NOTE — Progress Notes (Signed)
[name] is a 44 y.o. female who presents to [facility] clinic today with no complaints.    Pap Smear: Pap smear not completed today. Last Pap smear was in November 2019 at a clinic in [place] and was normal per patient. Per patient has no history of an abnormal Pap smear. Last Pap smear result is not available in Epic.   Physical exam: Breasts Breasts symmetrical. No skin abnormalities bilateral breasts. No nipple retraction bilateral breasts. No nipple discharge bilateral breasts. No lymphadenopathy. No lumps palpated bilateral breasts. No complaints of pain or tenderness on exam.       Pelvic/Bimanual Pap is not indicated today per B[facility]CP guidelines.    Smoking History: Patient has never smoked.   Patient Navigation: Patient education provided. Access to services provided for patient through [place] program. Spanish interpreter Natale Lay from [facility] provided.    Breast and Cervical Cancer Risk Assessment: Patient has family history of a paternal cousin having breast cancer. Patient has no known genetic mutations or history of radiation treatment to the chest before age 5. Patient does not have history of cervical dysplasia, immunocompromised, or DES exposure in-utero.  Risk Assessment    Risk Scores      02/07/2020 02/01/2019   Last edited by: Narda Rutherford, LPN Phillipa Morden, Carlye Grippe, RN   5-year risk: 0.6 % 0.5 %   Lifetime risk: 6.9 % 6.9 %          A: B[facility]CP exam without pap smear No complaints.  P: Referred patient to the [facility] for a screening mammogram on the mobile unit. Appointment scheduled Tuesday, February 07, 2020 at 0920.  Priscille Heidelberg, RN 02/07/2020 8:24 AM

## 2020-02-22 ENCOUNTER — Encounter: Payer: No Typology Code available for payment source | Admitting: Family Medicine

## 2020-03-13 ENCOUNTER — Other Ambulatory Visit: Payer: Self-pay

## 2020-03-13 ENCOUNTER — Other Ambulatory Visit: Payer: Self-pay | Admitting: Family Medicine

## 2020-03-13 ENCOUNTER — Encounter: Payer: Self-pay | Admitting: Family Medicine

## 2020-03-13 ENCOUNTER — Ambulatory Visit (INDEPENDENT_AMBULATORY_CARE_PROVIDER_SITE_OTHER): Payer: Self-pay | Admitting: Family Medicine

## 2020-03-13 VITALS — BP 98/61 | HR 62 | Wt 137.1 lb

## 2020-03-13 DIAGNOSIS — B009 Herpesviral infection, unspecified: Secondary | ICD-10-CM

## 2020-03-13 DIAGNOSIS — Z9289 Personal history of other medical treatment: Secondary | ICD-10-CM

## 2020-03-13 MED ORDER — VALACYCLOVIR HCL 500 MG PO TABS: 500.0000 mg | ORAL_TABLET | Freq: Two times a day (BID) | ORAL | 5 refills | Status: AC

## 2020-03-14 ENCOUNTER — Encounter: Payer: Self-pay | Admitting: Family Medicine

## 2020-03-14 NOTE — Progress Notes (Signed)
GYNECOLOGY OFFICE VISIT NOTE  History:   Veronica Jensen is a 45 y.o. G0P0000 here today for follow up of gynecogical workup initiated in the DR.  Patient reports she was in the [place] where she is from and she returned to regularly to visit.  She went to see her gynecologist who she often goes to see while she is there.  While there he did a Pap, she brings the results which shows negative cytology.  While there he also did a transvaginal ultrasound for unclear reasons which demonstrated normal anatomy including an endometrial stripe measuring 1.1 cm  Subsequently was recommended that she undergo a diagnostic hysteroscopy.  She presents today to discuss this.  She reports she has a regular period, it is light and lasts about 3 days, it is regular, she has no complaints with regard to her menses.  Also asked for refill of her Valtrex, says she has fairly frequent outbreaks and it is effective for controlling her symptoms.  Past Medical History:  Diagnosis Date  . Abdominal pain   . Asthma in adult without complication   . Chest pain   . Chronic neck pain   . Hyperthyroidism   . Language barrier   . Mood changes   . Nausea   . Ovarian cyst   . Shortness of breath   . Varicose veins of both lower extremities     Past Surgical History:  Procedure Laterality Date  . MYOMECTOMY      The following portions of the patient's history were reviewed and updated as appropriate: allergies, current medications, past family history, past medical history, past social history, past surgical history and problem list.     Review of Systems:  Pertinent items noted in HPI and remainder of comprehensive ROS otherwise negative.  Physical Exam:  BP 98/61   Pulse 62   Wt 137 lb 1.6 oz (62.2 kg)   LMP 02/17/2020 (Approximate)   BMI 24.99 kg/m  CONSTITUTIONAL: Well-developed, well-nourished female in no acute distress.  HEENT:  Normocephalic, atraumatic. External right  and left ear normal. No scleral icterus.  NECK: Normal range of motion, supple, no masses noted on observation SKIN: No rash noted. Not diaphoretic. No erythema. No pallor. MUSCULOSKELETAL: Normal range of motion. No edema noted. NEUROLOGIC: Alert and oriented to person, place, and time. Normal muscle tone coordination.  PSYCHIATRIC: Normal mood and affect. Normal behavior. Normal judgment and thought content. RESPIRATORY: Effort normal, no problems with respiration noted  Labs and Imaging No results found for this or any previous visit (from the past 168 hour(s)). No results found.    Assessment and Plan:   Problem List Items Addressed This Visit      Other   HSV (herpes simplex virus) infection   Relevant Medications   valACYclovir (VALTREX) 500 MG tablet    Other Visit Diagnoses    H/O pelvic ultrasound    -  Primary     #Follow-up pelvic ultrasound Reviewed patient's imaging and image report, there are no concerning findings.  Given that she still has menses and is premenopausal, and endometrial stripe of 1.1 cm is not of concern.  I discussed this with her at length and provided reassurance.  #HSV Provided refill of patient's Valtrex.  Routine preventative health maintenance measures emphasized. Please refer to After Visit Summary for other counseling recommendations.   Return if symptoms worsen or fail to improve.    Total face-to-face time with patient: 20 minutes.  Over 50% of  encounter was spent on counseling and coordination of care.   Venora Maples, MD/[facility] for [employer], [facility]

## 2020-03-20 ENCOUNTER — Ambulatory Visit: Payer: No Typology Code available for payment source | Admitting: Critical Care Medicine

## 2020-04-09 ENCOUNTER — Telehealth: Payer: Self-pay | Admitting: Critical Care Medicine

## 2020-04-09 NOTE — Telephone Encounter (Signed)
Disregard, patient scheduled with Meredeth Ide.

## 2020-04-09 NOTE — Telephone Encounter (Signed)
Patient called in and stated she was having an allergic reaction on her face. She stated her face was swollen and wants to be seen this week could she be scheduled with mobile? Please follow up.

## 2020-04-11 ENCOUNTER — Other Ambulatory Visit: Payer: Self-pay | Admitting: Nurse Practitioner

## 2020-04-11 ENCOUNTER — Encounter: Payer: Self-pay | Admitting: Nurse Practitioner

## 2020-04-11 ENCOUNTER — Other Ambulatory Visit: Payer: Self-pay

## 2020-04-11 ENCOUNTER — Ambulatory Visit: Payer: Self-pay | Attending: Nurse Practitioner | Admitting: Nurse Practitioner

## 2020-04-11 VITALS — BP 105/68 | HR 75 | Temp 98.7°F | Ht 62.0 in | Wt 140.0 lb

## 2020-04-11 DIAGNOSIS — R220 Localized swelling, mass and lump, head: Secondary | ICD-10-CM

## 2020-04-11 DIAGNOSIS — J3089 Other allergic rhinitis: Secondary | ICD-10-CM

## 2020-04-11 DIAGNOSIS — J4520 Mild intermittent asthma, uncomplicated: Secondary | ICD-10-CM

## 2020-04-11 MED ORDER — PREDNISONE 20 MG PO TABS: 20.0000 mg | ORAL_TABLET | Freq: Every day | ORAL | 0 refills | Status: DC

## 2020-04-11 MED ORDER — LORATADINE 10 MG PO TABS: 10.0000 mg | ORAL_TABLET | Freq: Every day | ORAL | 2 refills | Status: DC

## 2020-04-11 MED ORDER — MONTELUKAST SODIUM 10 MG PO TABS: 10.0000 mg | ORAL_TABLET | Freq: Every day | ORAL | 1 refills | Status: DC

## 2020-04-11 MED ORDER — BUDESONIDE-FORMOTEROL FUMARATE 160-4.5 MCG/ACT IN AERO: 2.0000 | INHALATION_SPRAY | Freq: Every evening | RESPIRATORY_TRACT | 6 refills | Status: DC

## 2020-04-11 MED ORDER — FLUTICASONE PROPIONATE 50 MCG/ACT NA SUSP: 2.0000 | Freq: Every day | NASAL | 6 refills | Status: DC

## 2020-04-11 MED ORDER — FLUTICASONE FUROATE-VILANTEROL 200-25 MCG/INH IN AEPB: 1.0000 | INHALATION_SPRAY | Freq: Every day | RESPIRATORY_TRACT | 1 refills | Status: DC

## 2020-04-11 NOTE — Progress Notes (Signed)
Assessment & Plan:  Veronica Jensen was seen today for facial swelling.  Diagnoses and all orders for this visit:  Facial swelling -     loratadine (CLARITIN) 10 MG tablet; Take 1 tablet (10 mg total) by mouth daily. -     fluticasone (FLONASE) 50 MCG/ACT nasal spray; Place 2 sprays into both nostrils daily. -     predniSONE (DELTASONE) 20 MG tablet; Take 1 tablet (20 mg total) by mouth daily with breakfast.  Mild intermittent asthma without complication -     fluticasone furoate-vilanterol (BREO ELLIPTA) 200-25 MCG/INH AEPB; Inhale 1 puff into the lungs daily. -     montelukast (SINGULAIR) 10 MG tablet; Take 1 tablet (10 mg total) by mouth at bedtime. -     budesonide-formoterol (SYMBICORT) 160-4.5 MCG/ACT inhaler; Inhale 2 puffs into the lungs at bedtime. Dosage different: 200 mcg - 6 mcg (prescribed in [place])  Environmental and seasonal allergies -     loratadine (CLARITIN) 10 MG tablet; Take 1 tablet (10 mg total) by mouth daily. -     montelukast (SINGULAIR) 10 MG tablet; Take 1 tablet (10 mg total) by mouth at bedtime. WILL HAVE HER FOLLOW UP WITH DR Delford Field REGARDING ALLERGY/BLOOD TESTING RESULTS   Patient has been counseled on age-appropriate routine health concerns for screening and prevention. These are reviewed and up-to-date. Referrals have been placed accordingly. Immunizations are up-to-date or declined.    Subjective:   Chief Complaint  Patient presents with  . Facial Swelling    Patient is here for facial swelling. Patient stated 3 weeks ago she went to her country and was treated for allergy, when she came back her face started swelling.   HPI Veronica Jensen 45 y.o. female presents to office today for facial swelling.  She has a past medical history of  Asthma, Allergies  Chronic neck pain, Hyperthyroidism  Facial swelling with onset 3 weeks ago. She traveled to the [place] in January. While she was there she was prescribed a few new  medications for allergies and asthma: Loratidine, BREO, and Budesonide. Prior to traveling she was only taking flonase and singulair  She also received botox in her forehead while out of the country but this was several weeks before she returned home. 3 days after returning home she had onset of facial swelling which she reports has only minimally improved as of today.  She has films here today which I did not review that she reports shows she has a deviated septum. I asked did she have any symptoms such as chronic rhinorrhea or difficulty breathing at night and she denies. She does endorse intermittent nasal congestion. Today there is no periorbital edema and swelling appears to be localized in the lower facial area. The botox injections she received in her forehead may be the cause for absence of edema in this area.   Lab results from the doctor she visited in January show eosinophil count as 0. She was instructed prior to leaving [place] to follow up with her PCP regarding her blood work results.    Review of Systems  Constitutional: Negative for fever, malaise/fatigue and weight loss.  HENT: Positive for congestion. Negative for nosebleeds.   Eyes: Negative.  Negative for blurred vision, double vision and photophobia.  Respiratory: Negative.  Negative for cough and shortness of breath.   Cardiovascular: Negative.  Negative for chest pain, palpitations and leg swelling.  Gastrointestinal: Negative.  Negative for heartburn, nausea and vomiting.  Musculoskeletal: Negative.  Negative for myalgias.  Skin:       SEE HPI  Neurological: Negative.  Negative for dizziness, focal weakness, seizures and headaches.  Endo/Heme/Allergies: Positive for environmental allergies.  Psychiatric/Behavioral: Negative.  Negative for suicidal ideas.    Past Medical History:  Diagnosis Date  . Abdominal pain   . Asthma in adult without complication   . Chest pain   . Chronic neck pain   .  Hyperthyroidism   . Language barrier   . Mood changes   . Nausea   . Ovarian cyst   . Shortness of breath   . Varicose veins of both lower extremities     Past Surgical History:  Procedure Laterality Date  . MYOMECTOMY      Family History  Problem Relation Age of Onset  . Thyroid disease Mother   . Hypercholesterolemia Father     Social History Reviewed with no changes to be made today.   Outpatient Medications Prior to Visit  Medication Sig Dispense Refill  . SYNTHROID 50 MCG tablet TAKE 1 TABLET (50 MCG TOTAL) BY MOUTH DAILY BEFORE BREAKFAST. 90 tablet 2  . budesonide-formoterol (SYMBICORT) 160-4.5 MCG/ACT inhaler Inhale 2 puffs into the lungs 2 (two) times daily. Dosage different: 200 mcg - 6 mcg (prescribed in [place])    . fluticasone (FLONASE) 50 MCG/ACT nasal spray Place 2 sprays into both nostrils daily. 16 g 6  . fluticasone furoate-vilanterol (BREO ELLIPTA) 200-25 MCG/INH AEPB Inhale 1 puff into the lungs daily.    Marland Kitchen loratadine (CLARITIN) 10 MG tablet Take 10 mg by mouth daily.    . montelukast (SINGULAIR) 10 MG tablet Take 1 tablet (10 mg total) by mouth at bedtime. 90 tablet 1  . oxymetazoline (AFRIN NASAL SPRAY) 0.05 % nasal spray Place 1 spray into both nostrils 2 (two) times daily. For two days prior to air travel and day of air travel (Patient not taking: No sig reported) 30 mL 0   No facility-administered medications prior to visit.    Allergies  Allergen Reactions  . Anesthesia S-I-40 [Propofol] Other (See Comments)    Hypotension       Objective:    BP 105/68 (BP Location: Left Arm, Patient Position: Sitting, Cuff Size: Normal)   Pulse 75   Temp 98.7 F (37.1 C) (Oral)   Ht 5\' 2"  (1.575 m)   Wt 140 lb (63.5 kg)   SpO2 97%   BMI 25.61 kg/m  Wt Readings from Last 3 Encounters:  04/11/20 140 lb (63.5 kg)  03/13/20 137 lb 1.6 oz (62.2 kg)  02/07/20 135 lb 11.2 oz (61.6 kg)    Physical Exam Vitals and nursing note reviewed.   Constitutional:      Appearance: She is well-developed and well-nourished.  HENT:     Head: Normocephalic. Raccoon eyes (s/p botox injection) present. No right periorbital erythema or left periorbital erythema.     Comments: SEE PHOTO    Mouth/Throat:     Lips: Pink.     Mouth: Mucous membranes are moist. No oral lesions or angioedema.     Dentition: Normal dentition. Does not have dentures. No dental tenderness, gingival swelling, dental caries, dental abscesses or gum lesions.     Tongue: No lesions.     Palate: No mass.     Pharynx: Oropharynx is clear. Uvula midline. No pharyngeal swelling, oropharyngeal exudate, posterior oropharyngeal erythema or uvula swelling.  Eyes:     Extraocular Movements: EOM normal.  Cardiovascular:     Rate  and Rhythm: Normal rate and regular rhythm.     Pulses: Intact distal pulses.     Heart sounds: Normal heart sounds. No murmur heard. No friction rub. No gallop.   Pulmonary:     Effort: Pulmonary effort is normal. No tachypnea or respiratory distress.     Breath sounds: Normal breath sounds. No decreased breath sounds, wheezing, rhonchi or rales.  Chest:     Chest wall: No tenderness.  Abdominal:     General: Bowel sounds are normal.     Palpations: Abdomen is soft.  Musculoskeletal:        General: No edema. Normal range of motion.     Cervical back: Normal range of motion.  Skin:    General: Skin is warm and dry.  Neurological:     Mental Status: She is alert and oriented to person, place, and time.     Coordination: Coordination normal.  Psychiatric:        Mood and Affect: Mood and affect normal.        Behavior: Behavior normal. Behavior is cooperative.        Thought Content: Thought content normal.        Judgment: Judgment normal.          Patient has been counseled extensively about nutrition and exercise as well as the importance of adherence with medications and regular follow-up. The patient was given clear instructions  to go to ER or return to medical center if symptoms don't improve, worsen or new problems develop. The patient verbalized understanding.   Follow-up: Return for she has a Appt with DR Delford Field IN a few weeks. Claiborne Rigg, FNP-BC [facility] [place], [place] [phone]   04/14/2020, 10:54 AM

## 2020-04-13 ENCOUNTER — Telehealth: Payer: Self-pay | Admitting: Critical Care Medicine

## 2020-04-13 NOTE — Telephone Encounter (Signed)
Pt came to the office to drop up a documents for [employer] assessment, document will be put in the provide boc

## 2020-04-14 ENCOUNTER — Encounter: Payer: Self-pay | Admitting: Nurse Practitioner

## 2020-04-26 ENCOUNTER — Ambulatory Visit: Payer: No Typology Code available for payment source | Admitting: Critical Care Medicine

## 2020-05-16 ENCOUNTER — Ambulatory Visit: Payer: Self-pay | Attending: Critical Care Medicine

## 2020-05-16 ENCOUNTER — Other Ambulatory Visit: Payer: Self-pay

## 2020-05-28 ENCOUNTER — Other Ambulatory Visit: Payer: Self-pay

## 2020-05-28 MED FILL — Levothyroxine Sodium Tab 50 MCG: ORAL | 30 days supply | Qty: 30 | Fill #0 | Status: CN

## 2020-05-30 ENCOUNTER — Other Ambulatory Visit: Payer: Self-pay

## 2020-05-31 ENCOUNTER — Other Ambulatory Visit: Payer: Self-pay

## 2020-06-04 ENCOUNTER — Other Ambulatory Visit: Payer: Self-pay

## 2020-06-05 ENCOUNTER — Other Ambulatory Visit: Payer: Self-pay

## 2020-06-05 MED FILL — Levothyroxine Sodium Tab 50 MCG: ORAL | 30 days supply | Qty: 30 | Fill #0 | Status: AC

## 2020-06-06 ENCOUNTER — Other Ambulatory Visit: Payer: Self-pay

## 2020-06-12 IMAGING — CT CT HEAR MORPH WITH CTA COR WITH SCORE WITH CA WITH CONTRAST AND
2 of 8 series · 4 of 20 positions shown, 5 images · IV contrast (APPLIED)
Comparison: None.
COMPARISON: None.

Addendum:
EXAM:
OVER-READ INTERPRETATION  CT CHEST

The following report is an over-read performed by radiologist Dr.
Sohan Osegueda [REDACTED] on 08/26/2018. This
over-read does not include interpretation of cardiac or coronary
anatomy or pathology. The coronary calcium score/coronary CTA
interpretation by the cardiologist is attached.
HISTORY: Chest Pain; Abnormal Stress Test
Cardiac/Coronary  CT
TECHNIQUE: The patient was scanned on a Siemens Force scanner.
PROTOCOL: A 120 kV prospective scan was triggered in the descending thoracic
aorta at 111 HU's. Axial non-contrast 3 mm slices were carried out
through the heart. The data set was analyzed on a dedicated work
station and scored using the Agatson method. Gantry rotation speed
was 250 msecs and collimation was .6 mm. Beta blockade and 0.8 mg of
sl NTG was given. The 3D data set was reconstructed in 5% intervals
of the 67-82 % of the R-R cycle. Diastolic phases were analyzed on a
dedicated work station using MPR, MIP and VRT modes. The patient
received 100mL OMNIPAQUE IOHEXOL 350 MG/ML SOLN of contrast.

[Series 10: ts syst sharp 47 % · axial · 0.29mm/px · z∈[+1034,+1076]mm · 2 of 318 slices shown]
[im 106/318  lung]
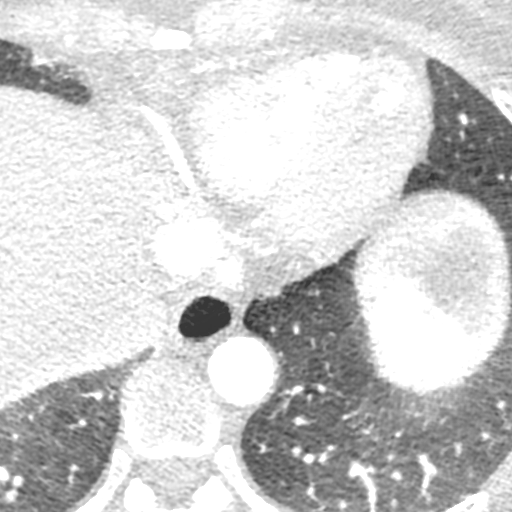
[im 212/318  lung]
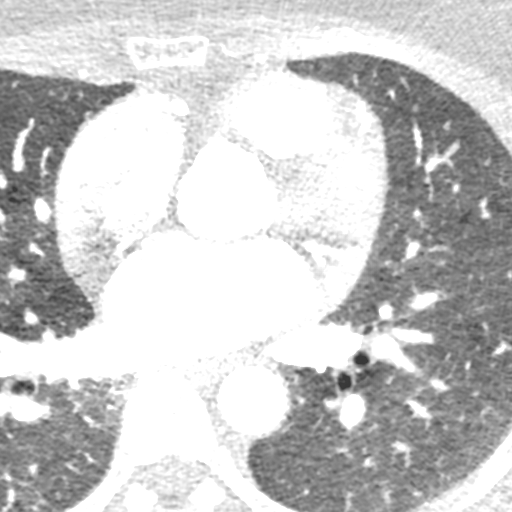

[Series 14: (id) · axial · 0.29mm/px · z∈[+1034,+1076]mm · 2 of 318 slices shown, 3 images]
[im 106/318  vessel]
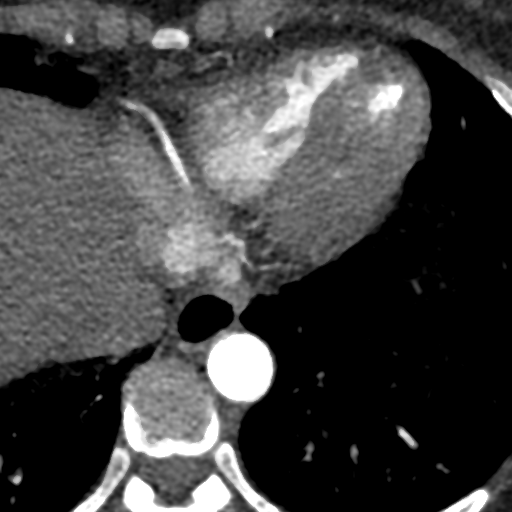
[im 106/318  lung]
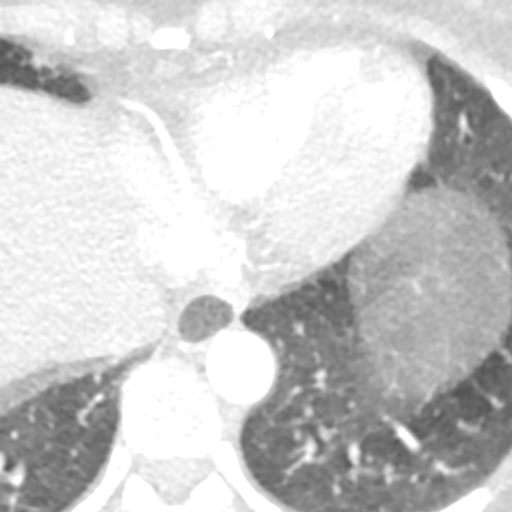
[im 212/318  vessel]
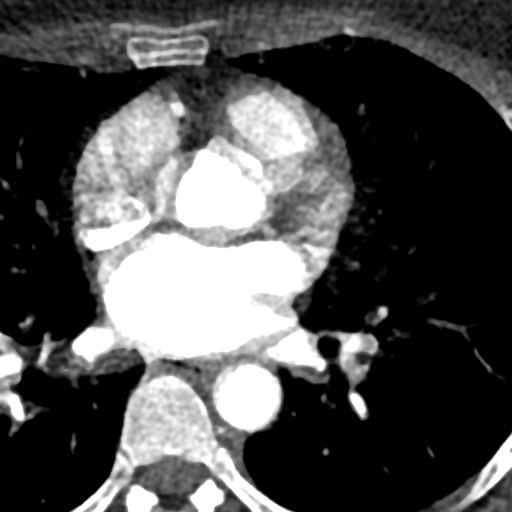

[4 of 20 positions shown; findings below may reference images not displayed]

FINDINGS: Within the visualized portions of the thorax there are no suspicious
appearing pulmonary nodules or masses, there is no acute
consolidative airspace disease, no pleural effusions, no
pneumothorax and no lymphadenopathy. Visualized portions of the
upper abdomen are unremarkable. There are no aggressive appearing
lytic or blastic lesions noted in the visualized portions of the
skeleton.
IMPRESSION: 1. No significant incidental noncardiac findings are noted.
FINDINGS: Coronary calcium score: The patient's coronary artery calcium score
is 0

Coronary arteries: Normal coronary origins.  Right dominance.

There is significant motion artifact on today's exam, limiting
complete assessment of the coronary arteries. All phases of cardiac
cycle examined as well as 300 ms reconstruction. Unable to resolve
motion artifact.

There is no proximal coronary artery disease.

RCA: Appears largely disease free.

LM: No left main stenosis or plaque.

LAD: Proximal LAD is patient without definite stenoses.

LCirc: Proximal Left circumflex is without definite stenosis.

Aorta: Normal size, 23 mm at the mid ascending aorta (level of the
PA bifurcation) measured double oblique. No calcifications. No
dissection.

Aortic Valve: No calcifications.

Other findings:

Normal pulmonary vein drainage into the left atrium.

Left atrial appendage incompletely assessed due to motion.

Normal size of the pulmonary artery.
IMPRESSION: 1. Study is significantly impacted by motion artifact. No definite
proximal CAD, CADRADS = 0. Mid and distal vessels not well assessed.

2. Coronary calcium score of 0. This was 0 percentile for age and
sex matched control.

3. Normal coronary origin with right dominance.

*** End of Addendum ***
EXAM:
OVER-READ INTERPRETATION  CT CHEST

The following report is an over-read performed by radiologist Dr.
Sohan Osegueda [REDACTED] on 08/26/2018. This
over-read does not include interpretation of cardiac or coronary
anatomy or pathology. The coronary calcium score/coronary CTA
interpretation by the cardiologist is attached.
FINDINGS: Within the visualized portions of the thorax there are no suspicious
appearing pulmonary nodules or masses, there is no acute
consolidative airspace disease, no pleural effusions, no
pneumothorax and no lymphadenopathy. Visualized portions of the
upper abdomen are unremarkable. There are no aggressive appearing
lytic or blastic lesions noted in the visualized portions of the
skeleton.
IMPRESSION: 1. No significant incidental noncardiac findings are noted.

## 2020-06-22 ENCOUNTER — Ambulatory Visit: Payer: No Typology Code available for payment source

## 2020-06-25 ENCOUNTER — Other Ambulatory Visit: Payer: Self-pay

## 2020-07-02 ENCOUNTER — Other Ambulatory Visit: Payer: Self-pay | Admitting: Critical Care Medicine

## 2020-07-02 MED ORDER — SYNTHROID 50 MCG PO TABS
50.0000 ug | ORAL_TABLET | Freq: Every day | ORAL | 4 refills | Status: DC
  Filled 2020-07-02: qty 30, 30d supply, fill #0
  Filled 2020-08-02: qty 30, 30d supply, fill #1
  Filled 2020-08-31: qty 30, 30d supply, fill #2
  Filled 2020-10-02: qty 30, 30d supply, fill #3
  Filled 2020-11-07: qty 30, 30d supply, fill #4

## 2020-07-02 NOTE — Telephone Encounter (Signed)
Requested Prescriptions  Pending Prescriptions Disp Refills  . SYNTHROID 50 MCG tablet 30 tablet 4    Sig: TAKE 1 TABLET (50 MCG TOTAL) BY MOUTH DAILY BEFORE BREAKFAST.     Endocrinology:  Hypothyroid Agents Failed - 07/02/2020  4:50 PM      Failed - TSH needs to be rechecked within 3 months after an abnormal result. Refill until TSH is due.      Passed - TSH in normal range and within 360 days    TSH  Date Value Ref Range Status  12/01/2019 1.590 0.450 - 4.500 uIU/mL Final         Passed - Valid encounter within last 12 months    Recent Outpatient Visits          2 months ago Facial swelling   [facility] [place], [place] W, NP   5 months ago Bilateral chronic serous otitis media   [facility] Storm Frisk, MD   7 months ago Hypothyroidism, unspecified type   [facility] Storm Frisk, MD      Future Appointments            In 2 weeks Storm Frisk, MD [facility]

## 2020-07-02 NOTE — Telephone Encounter (Signed)
Medication: SYNTHROID 50 MCG tablet [[id number]]   Has the patient contacted their pharmacy? YES  (Agent: If no, request that the patient contact the pharmacy for the refill.) (Agent: If yes, when and what did the pharmacy advise?)  Preferred Pharmacy (with phone number or street name): [facility] [address] [place] [address] Phone: [phone] Fax: [phone] Hours: M-F 8:30a-5:30p    Agent: Please be advised that RX refills may take up to 3 business days. We ask that you follow-up with your pharmacy.

## 2020-07-03 ENCOUNTER — Other Ambulatory Visit: Payer: Self-pay

## 2020-07-04 ENCOUNTER — Ambulatory Visit: Payer: No Typology Code available for payment source

## 2020-07-17 ENCOUNTER — Ambulatory Visit: Payer: No Typology Code available for payment source | Admitting: Critical Care Medicine

## 2020-07-18 ENCOUNTER — Other Ambulatory Visit: Payer: Self-pay

## 2020-07-18 ENCOUNTER — Ambulatory Visit: Payer: Self-pay | Attending: Family Medicine

## 2020-07-23 ENCOUNTER — Other Ambulatory Visit: Payer: Self-pay

## 2020-07-27 ENCOUNTER — Other Ambulatory Visit: Payer: Self-pay

## 2020-08-02 ENCOUNTER — Other Ambulatory Visit: Payer: Self-pay

## 2020-08-16 ENCOUNTER — Telehealth: Payer: No Typology Code available for payment source | Admitting: Critical Care Medicine

## 2020-08-31 ENCOUNTER — Other Ambulatory Visit: Payer: Self-pay

## 2020-09-05 ENCOUNTER — Other Ambulatory Visit: Payer: Self-pay

## 2020-10-02 ENCOUNTER — Other Ambulatory Visit: Payer: Self-pay

## 2020-10-03 ENCOUNTER — Other Ambulatory Visit: Payer: Self-pay

## 2020-11-07 ENCOUNTER — Other Ambulatory Visit: Payer: Self-pay

## 2020-11-07 MED FILL — Fluticasone Propionate Nasal Susp 50 MCG/ACT: NASAL | 30 days supply | Qty: 16 | Fill #0 | Status: AC

## 2020-11-09 ENCOUNTER — Other Ambulatory Visit: Payer: Self-pay

## 2020-12-06 ENCOUNTER — Other Ambulatory Visit: Payer: Self-pay

## 2020-12-06 ENCOUNTER — Other Ambulatory Visit: Payer: Self-pay | Admitting: Pharmacist

## 2020-12-06 ENCOUNTER — Other Ambulatory Visit: Payer: Self-pay | Admitting: Critical Care Medicine

## 2020-12-06 DIAGNOSIS — E039 Hypothyroidism, unspecified: Secondary | ICD-10-CM

## 2020-12-06 MED ORDER — SYNTHROID 50 MCG PO TABS
ORAL_TABLET | ORAL | 0 refills | Status: DC
  Filled 2020-12-06: qty 30, 30d supply, fill #0

## 2020-12-07 ENCOUNTER — Other Ambulatory Visit: Payer: Self-pay

## 2020-12-19 ENCOUNTER — Ambulatory Visit: Payer: Self-pay | Attending: Physician Assistant | Admitting: Physician Assistant

## 2020-12-19 ENCOUNTER — Other Ambulatory Visit: Payer: Self-pay

## 2020-12-19 ENCOUNTER — Encounter: Payer: Self-pay | Admitting: Physician Assistant

## 2020-12-19 VITALS — BP 106/71 | HR 80 | Resp 16 | Wt 140.0 lb

## 2020-12-19 DIAGNOSIS — Z789 Other specified health status: Secondary | ICD-10-CM

## 2020-12-19 DIAGNOSIS — E039 Hypothyroidism, unspecified: Secondary | ICD-10-CM

## 2020-12-19 DIAGNOSIS — E559 Vitamin D deficiency, unspecified: Secondary | ICD-10-CM

## 2020-12-19 DIAGNOSIS — R5383 Other fatigue: Secondary | ICD-10-CM

## 2020-12-19 MED ORDER — SYNTHROID 50 MCG PO TABS
ORAL_TABLET | ORAL | 5 refills | Status: DC
  Filled 2020-12-19: qty 30, fill #0
  Filled 2021-01-08: qty 30, 30d supply, fill #0
  Filled 2021-02-05: qty 30, 30d supply, fill #1
  Filled 2021-03-11: qty 30, 30d supply, fill #0

## 2020-12-19 NOTE — Progress Notes (Signed)
Patient ID: Veronica Jensen, female   DOB: 06/22/1975, 45 y.o.   MRN: [id number]   Veronica Jensen, is a 45 y.o. female  [id number]  [id number]:[id number]  DOB - April 21, 1975  Chief Complaint  Patient presents with   Medication Refill   Labs Only       Subjective:   Veronica Jensen is a 45 y.o. female here today for fatigue and med RF.  She is compliant with synthroid .  Feels like she's been losing her hair.  Wants to have her vitamin levels checked.   Patient has No headache, No chest pain, No abdominal pain - No Nausea, No new weakness tingling or numbness, No Cough - SOB.  No problems updated.  ALLERGIES: Allergies  Allergen Reactions   Anesthesia S-I-40 [Propofol] Other (See Comments)    Hypotension    PAST MEDICAL HISTORY: Past Medical History:  Diagnosis Date   Abdominal pain    Asthma in adult without complication    Chest pain    Chronic neck pain    Hyperthyroidism    Language barrier    Mood changes    Nausea    Ovarian cyst    Shortness of breath    Varicose veins of both lower extremities     MEDICATIONS AT HOME: Prior to Admission medications   Medication Sig Start Date End Date Taking? Authorizing Provider  fluticasone (FLONASE) 50 MCG/ACT nasal spray PLACE 2 SPRAYS INTO BOTH NOSTRILS DAILY. 04/11/20 04/11/21 Yes Claiborne Rigg, NP  cetirizine (ZYRTEC) 10 MG tablet TAKE 1 TABLET (10 MG TOTAL) BY MOUTH DAILY. 12/01/19 11/30/20  Storm Frisk, MD  fluticasone (FLONASE) 50 MCG/ACT nasal spray Place 2 sprays into both nostrils daily. Patient not taking: Reported on 12/19/2020 04/11/20   Claiborne Rigg, NP  fluticasone (FLONASE) 50 MCG/ACT nasal spray PLACE 2 SPRAYS INTO BOTH NOSTRILS DAILY. 12/01/19 11/30/20  Storm Frisk, MD  fluticasone furoate-vilanterol (BREO ELLIPTA) 200-25 MCG/INH AEPB Inhale 1 puff into the lungs daily. Patient not taking: Reported on 12/19/2020 04/11/20   Claiborne Rigg, NP  fluticasone  furoate-vilanterol (BREO ELLIPTA) 200-25 MCG/INH AEPB INHALE 1 PUFF INTO THE LUNGS DAILY. Patient not taking: Reported on 12/19/2020 04/11/20 04/11/21  Claiborne Rigg, NP  loratadine (CLARITIN) 10 MG tablet Take 1 tablet (10 mg total) by mouth daily. 04/11/20 07/10/20  Claiborne Rigg, NP  loratadine (CLARITIN) 10 MG tablet TAKE 1 TABLET (10 MG TOTAL) BY MOUTH DAILY. Patient not taking: Reported on 12/19/2020 04/11/20 04/11/21  Claiborne Rigg, NP  montelukast (SINGULAIR) 10 MG tablet Take 1 tablet (10 mg total) by mouth at bedtime. Patient not taking: Reported on 12/19/2020 04/11/20   Claiborne Rigg, NP  montelukast (SINGULAIR) 10 MG tablet TAKE 1 TABLET (10 MG TOTAL) BY MOUTH AT BEDTIME. Patient not taking: Reported on 12/19/2020 04/11/20 04/11/21  Claiborne Rigg, NP  montelukast (SINGULAIR) 10 MG tablet TAKE 1 TABLET (10 MG TOTAL) BY MOUTH AT BEDTIME. 12/01/19 11/30/20  Storm Frisk, MD  oxymetazoline (AFRIN NASAL SPRAY) 0.05 % nasal spray Place 1 spray into both nostrils 2 (two) times daily. For two days prior to air travel and day of air travel Patient not taking: No sig reported 01/18/20   Storm Frisk, MD  SYNTHROID 50 MCG tablet TAKE 1 TABLET (50 MCG TOTAL) BY MOUTH DAILY BEFORE BREAKFAST. 12/19/20   Anders Simmonds, PA-C  valACYclovir (VALTREX) 500 MG tablet TAKE 1 TABLET (500 MG TOTAL) BY MOUTH 2 (TWO)  TIMES DAILY FOR 5 DAYS. Patient not taking: Reported on 12/19/2020 03/13/20 03/13/21  Venora Maples, MD  nitroGLYCERIN (NITROSTAT) 0.4 MG SL tablet Place 1 tablet (0.4 mg total) under the tongue every 5 (five) minutes as needed for chest pain. Patient not taking: No sig reported 06/08/18 05/23/19  Kallie Locks, FNP    ROS: Neg HEENT Neg resp Neg cardiac Neg GI Neg GU Neg MS Neg psych Neg neuro  Objective:   Vitals:   12/19/20 1522  BP: 106/71  Pulse: 80  Resp: 16  SpO2: 100%  Weight: 140 lb (63.5 kg)   Exam General appearance : Awake, alert, not in any  distress. Speech Clear. Not toxic looking HEENT: Atraumatic and Normocephalic Neck: Supple, no JVD. No cervical lymphadenopathy.  Chest: Good air entry bilaterally, CTAB.  No rales/rhonchi/wheezing CVS: S1 S2 regular, no murmurs.  No scalp rash Extremities: B/L Lower Ext shows no edema, both legs are warm to touch Neurology: Awake alert, and oriented X 3, CN II-XII intact, Non focal Skin: No Rash  Data Review Lab Results  Component Value Date   HGBA1C 5.4 06/01/2019   HGBA1C 5.5 06/08/2018    Assessment & Plan   1. Hypothyroidism, unspecified type Will check levels and adjust as needed - SYNTHROID 50 MCG tablet; TAKE 1 TABLET (50 MCG TOTAL) BY MOUTH DAILY BEFORE BREAKFAST.  Dispense: 30 tablet; Refill: 5 - Thyroid Panel With TSH  2. Vitamin D deficiency - Vitamin D, 25-hydroxy  3. Fatigue, unspecified type - B12 and Folate Panel  4. Language barrier [employer] "Veronica Jensen" interpreters used and additional time performing visit was required.   Patient have been counseled extensively about nutrition and exercise. Other issues discussed during this visit include: low cholesterol diet, weight control and daily exercise, foot care, annual eye examinations at Ophthalmology, importance of adherence with medications and regular follow-up. We also discussed long term complications of uncontrolled diabetes and hypertension.   Return in about 3 months (around 03/21/2021) for PCP.  The patient was given clear instructions to go to ER or return to medical center if symptoms don't improve, worsen or new problems develop. The patient verbalized understanding. The patient was told to call to get lab results if they haven't heard anything in the next week.      Georgian Co, PA-C [facility] [place], [place] [phone]   12/19/2020, 3:52 PM

## 2020-12-20 ENCOUNTER — Other Ambulatory Visit: Payer: Self-pay | Admitting: Physician Assistant

## 2020-12-20 ENCOUNTER — Other Ambulatory Visit: Payer: Self-pay

## 2020-12-20 LAB — THYROID PANEL WITH TSH
Free Thyroxine Index: 2.1 (ref 1.2–4.9)
T3 Uptake Ratio: 25 % (ref 24–39)
T4, Total: 8.2 ug/dL (ref 4.5–12.0)
TSH: 0.965 u[IU]/mL (ref 0.450–4.500)

## 2020-12-20 LAB — B12 AND FOLATE PANEL
Folate: 8.9 ng/mL (ref >3.0)
Vitamin B-12: 689 pg/mL (ref 232–1245)

## 2020-12-20 LAB — VITAMIN D 25 HYDROXY (VIT D DEFICIENCY, FRACTURES): Vit D, 25-Hydroxy: 23.1 ng/mL — ABNORMAL LOW (ref 30.0–100.0)

## 2020-12-20 MED ORDER — VITAMIN D (ERGOCALCIFEROL) 1.25 MG (50000 UNIT) PO CAPS
50000.0000 [IU] | ORAL_CAPSULE | ORAL | 0 refills | Status: DC
  Filled 2020-12-20: qty 4, 28d supply, fill #0
  Filled 2021-01-18: qty 4, 28d supply, fill #1
  Filled 2021-02-18: qty 4, 28d supply, fill #2
  Filled 2021-03-11: qty 4, 28d supply, fill #0

## 2020-12-26 ENCOUNTER — Other Ambulatory Visit: Payer: Self-pay

## 2021-01-08 ENCOUNTER — Other Ambulatory Visit: Payer: Self-pay

## 2021-01-08 MED FILL — Valacyclovir HCl Tab 500 MG: ORAL | 5 days supply | Qty: 10 | Fill #0 | Status: AC

## 2021-01-18 ENCOUNTER — Other Ambulatory Visit: Payer: Self-pay

## 2021-01-21 ENCOUNTER — Other Ambulatory Visit: Payer: Self-pay | Admitting: Obstetrics and Gynecology

## 2021-01-21 DIAGNOSIS — Z1231 Encounter for screening mammogram for malignant neoplasm of breast: Secondary | ICD-10-CM

## 2021-02-05 ENCOUNTER — Other Ambulatory Visit: Payer: Self-pay

## 2021-02-07 ENCOUNTER — Other Ambulatory Visit: Payer: Self-pay

## 2021-02-18 ENCOUNTER — Other Ambulatory Visit: Payer: Self-pay

## 2021-02-25 ENCOUNTER — Other Ambulatory Visit: Payer: Self-pay

## 2021-02-27 ENCOUNTER — Other Ambulatory Visit: Payer: Self-pay

## 2021-02-27 MED FILL — Valacyclovir HCl Tab 500 MG: ORAL | 5 days supply | Qty: 10 | Fill #0 | Status: AC

## 2021-03-04 ENCOUNTER — Other Ambulatory Visit: Payer: Self-pay

## 2021-03-04 ENCOUNTER — Ambulatory Visit: Payer: Self-pay | Attending: Critical Care Medicine

## 2021-03-05 ENCOUNTER — Ambulatory Visit: Payer: Self-pay

## 2021-03-11 ENCOUNTER — Other Ambulatory Visit: Payer: Self-pay

## 2021-03-11 MED FILL — Valacyclovir HCl Tab 500 MG: ORAL | 5 days supply | Qty: 10 | Fill #1 | Status: AC

## 2021-03-13 ENCOUNTER — Other Ambulatory Visit: Payer: Self-pay

## 2021-03-28 ENCOUNTER — Encounter: Payer: Self-pay | Admitting: Critical Care Medicine

## 2021-03-28 ENCOUNTER — Ambulatory Visit: Payer: Self-pay | Attending: Critical Care Medicine | Admitting: Critical Care Medicine

## 2021-03-28 ENCOUNTER — Other Ambulatory Visit: Payer: Self-pay

## 2021-03-28 VITALS — BP 108/77 | HR 68 | Resp 16 | Wt 137.2 lb

## 2021-03-28 DIAGNOSIS — B009 Herpesviral infection, unspecified: Secondary | ICD-10-CM

## 2021-03-28 DIAGNOSIS — E559 Vitamin D deficiency, unspecified: Secondary | ICD-10-CM

## 2021-03-28 DIAGNOSIS — E039 Hypothyroidism, unspecified: Secondary | ICD-10-CM

## 2021-03-28 DIAGNOSIS — Z789 Other specified health status: Secondary | ICD-10-CM

## 2021-03-28 DIAGNOSIS — Z1211 Encounter for screening for malignant neoplasm of colon: Secondary | ICD-10-CM

## 2021-03-28 DIAGNOSIS — J302 Other seasonal allergic rhinitis: Secondary | ICD-10-CM

## 2021-03-28 MED ORDER — SYNTHROID 50 MCG PO TABS
ORAL_TABLET | ORAL | 5 refills | Status: DC
  Filled 2021-03-28: qty 30, fill #0
  Filled 2021-04-09: qty 30, 30d supply, fill #0
  Filled 2021-05-07: qty 30, 30d supply, fill #1
  Filled 2021-06-17: qty 30, 30d supply, fill #2
  Filled 2021-07-08: qty 30, 30d supply, fill #3
  Filled 2021-08-21: qty 30, 30d supply, fill #4
  Filled 2021-09-25: qty 30, 30d supply, fill #5

## 2021-03-28 MED ORDER — FLUTICASONE PROPIONATE 50 MCG/ACT NA SUSP
2.0000 | Freq: Every day | NASAL | 6 refills | Status: DC
  Filled 2021-03-28: qty 16, 30d supply, fill #0

## 2021-03-28 MED ORDER — CLOTRIMAZOLE-BETAMETHASONE 1-0.05 % EX CREA
1.0000 "application " | TOPICAL_CREAM | Freq: Two times a day (BID) | CUTANEOUS | 0 refills | Status: DC
  Filled 2021-03-28: qty 30, 15d supply, fill #0

## 2021-03-28 MED ORDER — CETIRIZINE HCL 10 MG PO TABS
ORAL_TABLET | Freq: Every day | ORAL | 11 refills | Status: DC
  Filled 2021-03-28: qty 30, 30d supply, fill #0

## 2021-03-28 MED ORDER — VITAMIN D (ERGOCALCIFEROL) 1.25 MG (50000 UNIT) PO CAPS
50000.0000 [IU] | ORAL_CAPSULE | ORAL | 3 refills | Status: DC
  Filled 2021-03-28 – 2021-04-09 (×2): qty 12, 84d supply, fill #0
  Filled 2021-05-24: qty 12, 84d supply, fill #1
  Filled 2021-09-25: qty 12, 84d supply, fill #2
  Filled 2021-11-29: qty 12, 84d supply, fill #3

## 2021-03-28 MED ORDER — VALACYCLOVIR HCL 500 MG PO TABS
500.0000 mg | ORAL_TABLET | Freq: Two times a day (BID) | ORAL | 6 refills | Status: AC
  Filled 2021-03-28: qty 10, 5d supply, fill #0
  Filled 2021-05-21: qty 10, 5d supply, fill #1
  Filled 2021-06-17: qty 10, 5d supply, fill #2
  Filled 2021-07-08: qty 10, 5d supply, fill #3
  Filled 2021-10-30: qty 30, 30d supply, fill #4

## 2021-03-28 NOTE — Patient Instructions (Addendum)
We have sent refills of Synthroid, Vitamin D, cetirizine, and Flonase to the pharmacy. Please start using the cetirizine and Flonase to help with your throat tightness. We also will send a cream that will help with the itching.  You received the tDAP vaccine today which will protect you from tetanus.  Please pick up the colon cancer screening kit from the lab.  Labs will be drawn to check your Vitamin D levels.  Please sign up for MyChart. There is a Spanish version that you can use and request refills of your medication on.  Please return to see Dr. Joya Gaskins in 3 months.    Hemos enviado reposiciones de Synthroid, Vitamin D, cetirizine y Flonase a la farmacia. Comience a usar cetirizina y Flonase para ayudar con la opresin en la garganta. Tambin le enviaremos una crema que le ayudar con la picazn.  Recibi la vacuna tDAP hoy que lo proteger contra el ttanos.  Recoja el kit de deteccin de cncer de colon del laboratorio.  Se extraern laboratorios para verificar sus niveles de vitamina D.  Regstrese en MyChart. Hay una versin en espaol que puede usar y Sales executive resurtidos de su medicamento.  Vuelva a ver al Dr. Joya Gaskins en 3 meses.

## 2021-03-28 NOTE — Assessment & Plan Note (Signed)
The tightness in the throat that the patient experiencing seems to be related to allergic rhinitis. Inspection of her throat showed diffuse erythema and cobblestoning. Recommended to the patient that she restart fluticasone nasal spray and cetirizine. Consistent use of these medications should improve the inflammation caused by allergens.

## 2021-03-28 NOTE — Assessment & Plan Note (Signed)
Maintain current dose of Synthroid, 50 mcg. Patient reports no hypothyroidism symptoms of fatigue, pallor, or hair loss at this time. Last TSH was 0.965 and T4 was 8.2, so within normal limits. There is no need to retest thyroid levels today.

## 2021-03-28 NOTE — Assessment & Plan Note (Signed)
Vitamin D levels will be checked in lab today. Will continue with Vitamin D, 50,000 IU. Prescription sent to pharmacy.

## 2021-03-28 NOTE — Assessment & Plan Note (Signed)
Had conversation with patient regarding the ways she could better access refills for her medications. Educated patient on accessing MyChart in [place].

## 2021-03-28 NOTE — Progress Notes (Signed)
Established Patient Office Visit  Subjective:  Patient ID: Veronica Jensen, female    DOB: 1975-08-21  Age: 46 y.o. MRN: [id number]  CC: No chief complaint on file.   HPI Veronica Jensen, Veronica Jensen, provided Spanish language interpretation services today.  Veronica Jensen Veronica Jensen presents for primary care follow up. She last saw Veronica Caldron, PA-C in November 2022 for hypothyroidism and Vitamin D deficiency. At that time, her TSH and T4 levels were tested and within normal range on levothyroxine 50 mcg. She was Vitamin D deficient and was prescribed weekly Vitamin D at 50,000 IU.  Today she reports that she is doing well on her Synthroid and Vitamin D. She requests refills for these medications. She is also inquiring if there is a cream available to treat her genital herpes. She takes Valacyclovir for this, but is looking for something that will treat the symptoms of itching and irritation.  Her only complaint today is some throat irritation. She states that her throat feels full, and it can make it difficult to swallow sometimes. She has taken Flonase and cetirizine in the past for seasonal allergies, but is not currently taking any medications for allergies.  She is due for colon cancer screening and plans on obtaining the FOBT kit from the lab. She also is due for tDAP vaccination which she will get today.   Past Medical History:  Diagnosis Date   Abdominal pain    Asthma in adult without complication    Chest pain    Chronic neck pain    Hyperthyroidism    Language barrier    Mood changes    Nausea    Ovarian cyst    Shortness of breath    Varicose veins of both lower extremities     Past Surgical History:  Procedure Laterality Date   MYOMECTOMY      Family History  Problem Relation Age of Onset   Thyroid disease Mother    Hypercholesterolemia Father     Social History   Socioeconomic History   Marital status: Married    Spouse name: Not on file   Number of  children: Not on file   Years of education: Not on file   Highest education level: Bachelor's degree (e.g., BA, AB, BS)  Occupational History   Not on file  Tobacco Use   Smoking status: Never   Smokeless tobacco: Never  Vaping Use   Vaping Use: Never used  Substance and Sexual Activity   Alcohol use: Not Currently   Drug use: Not Currently   Sexual activity: Not Currently    Birth control/protection: None  Other Topics Concern   Not on file  Social History Narrative   Not on file   Social Determinants of Health   Financial Resource Strain: Not on file  Food Insecurity: Not on file  Transportation Needs: Not on file  Physical Activity: Not on file  Stress: Not on file  Social Connections: Not on file  Intimate Partner Violence: Not on file    Outpatient Medications Prior to Visit  Medication Sig Dispense Refill   fluticasone (FLONASE) 50 MCG/ACT nasal spray PLACE 2 SPRAYS INTO BOTH NOSTRILS DAILY. 16 g 6   SYNTHROID 50 MCG tablet TAKE 1 TABLET (50 MCG TOTAL) BY MOUTH DAILY BEFORE BREAKFAST. 30 tablet 5   Vitamin D, Ergocalciferol, (DRISDOL) 1.25 MG (50000 UNIT) CAPS capsule Take 1 capsule (50,000 Units total) by mouth every 7 (seven) days. 16 capsule 0   cetirizine (ZYRTEC) 10 MG tablet  TAKE 1 TABLET (10 MG TOTAL) BY MOUTH DAILY. (Patient not taking: Reported on 03/28/2021) 30 tablet 11   fluticasone (FLONASE) 50 MCG/ACT nasal spray Place 2 sprays into both nostrils daily. (Patient not taking: Reported on 12/19/2020) 16 g 6   fluticasone (FLONASE) 50 MCG/ACT nasal spray PLACE 2 SPRAYS INTO BOTH NOSTRILS DAILY. 16 g 6   fluticasone furoate-vilanterol (BREO ELLIPTA) 200-25 MCG/INH AEPB Inhale 1 puff into the lungs daily. (Patient not taking: Reported on 12/19/2020) 60 each 1   fluticasone furoate-vilanterol (BREO ELLIPTA) 200-25 MCG/INH AEPB INHALE 1 PUFF INTO THE LUNGS DAILY. (Patient not taking: Reported on 12/19/2020) 60 each 1   loratadine (CLARITIN) 10 MG tablet Take 1 tablet  (10 mg total) by mouth daily. 90 tablet 2   loratadine (CLARITIN) 10 MG tablet TAKE 1 TABLET (10 MG TOTAL) BY MOUTH DAILY. (Patient not taking: Reported on 12/19/2020) 90 tablet 2   montelukast (SINGULAIR) 10 MG tablet Take 1 tablet (10 mg total) by mouth at bedtime. (Patient not taking: Reported on 12/19/2020) 90 tablet 1   montelukast (SINGULAIR) 10 MG tablet TAKE 1 TABLET (10 MG TOTAL) BY MOUTH AT BEDTIME. (Patient not taking: Reported on 12/19/2020) 90 tablet 1   montelukast (SINGULAIR) 10 MG tablet TAKE 1 TABLET (10 MG TOTAL) BY MOUTH AT BEDTIME. (Patient not taking: Reported on 03/28/2021) 90 tablet 1   oxymetazoline (AFRIN NASAL SPRAY) 0.05 % nasal spray Place 1 spray into both nostrils 2 (two) times daily. For two days prior to air travel and day of air travel (Patient not taking: No sig reported) 30 mL 0   No facility-administered medications prior to visit.    Allergies  Allergen Reactions   Anesthesia S-I-40 [Propofol] Other (See Comments)    Hypotension    ROS Review of Systems  Constitutional:  Negative for fatigue and unexpected weight change.  HENT:  Positive for sore throat and trouble swallowing (throat feels tight). Negative for congestion.   Eyes: Negative.   Respiratory:  Negative for cough and shortness of breath.   Gastrointestinal:  Positive for nausea. Negative for diarrhea and vomiting.  Endocrine: Negative for cold intolerance.  Genitourinary:  Positive for genital sores. Negative for dysuria.  Musculoskeletal:  Negative for arthralgias and myalgias.  Skin:  Negative for rash.  Neurological:  Negative for dizziness and headaches.  Psychiatric/Behavioral: Negative.  Negative for dysphoric mood. The patient is not nervous/anxious.      Objective:    Physical Exam Constitutional:      Appearance: Normal appearance. She is normal weight.  HENT:     Head: Normocephalic and atraumatic.     Right Ear: External ear normal.     Left Ear: External ear normal.      Mouth/Throat:     Mouth: Mucous membranes are moist.     Dentition: Normal dentition. No dental caries.     Pharynx: Oropharynx is clear. Posterior oropharyngeal erythema present.  Eyes:     General: No scleral icterus. Neck:     Thyroid: No thyroid mass, thyromegaly or thyroid tenderness.  Cardiovascular:     Rate and Rhythm: Normal rate and regular rhythm.     Pulses: Normal pulses.     Heart sounds: Normal heart sounds. No murmur heard.   No friction rub. No gallop.  Pulmonary:     Effort: Pulmonary effort is normal.     Breath sounds: Normal breath sounds. No wheezing, rhonchi or rales.  Abdominal:     General: Abdomen is flat.  Musculoskeletal:        General: No swelling. Normal range of motion.     Cervical back: Normal range of motion and neck supple. No tenderness.     Right lower leg: No edema.     Left lower leg: No edema.  Skin:    General: Skin is warm and dry.  Neurological:     General: No focal deficit present.     Mental Status: She is alert and oriented to person, place, and time.  Psychiatric:        Mood and Affect: Mood normal.        Behavior: Behavior normal.        Thought Content: Thought content normal.        Judgment: Judgment normal.    BP 108/77    Pulse 68    Resp 16    Wt 137 lb 3.2 oz (62.2 kg)    SpO2 100%    BMI 25.09 kg/m  Wt Readings from Last 3 Encounters:  03/28/21 137 lb 3.2 oz (62.2 kg)  12/19/20 140 lb (63.5 kg)  04/11/20 140 lb (63.5 kg)     Health Maintenance Due  Topic Date Due   TETANUS/TDAP  Never done   COLON CANCER SCREENING ANNUAL FOBT  Never done    There are no preventive care reminders to display for this patient.  Lab Results  Component Value Date   TSH 0.965 12/19/2020   Lab Results  Component Value Date   WBC 5.3 06/01/2019   HGB 12.1 06/01/2019   HCT 37.5 06/01/2019   MCV 82 06/01/2019   PLT 367 06/01/2019   Lab Results  Component Value Date   NA 142 01/18/2020   K 4.7 01/18/2020   CO2 22  01/18/2020   GLUCOSE 90 01/18/2020   BUN 12 01/18/2020   CREATININE 0.91 01/18/2020   BILITOT 0.7 01/18/2020   ALKPHOS 67 01/18/2020   AST 16 01/18/2020   ALT 11 01/18/2020   PROT 7.2 01/18/2020   ALBUMIN 4.3 01/18/2020   CALCIUM 9.6 01/18/2020   ANIONGAP 10 06/08/2018   Lab Results  Component Value Date   CHOL 173 06/01/2019   Lab Results  Component Value Date   HDL 59 06/01/2019   Lab Results  Component Value Date   LDLCALC 100 (H) 06/01/2019   Lab Results  Component Value Date   TRIG 76 06/01/2019   Lab Results  Component Value Date   CHOLHDL 2.9 06/01/2019   Lab Results  Component Value Date   HGBA1C 5.4 06/01/2019      Assessment & Plan:   Problem List Items Addressed This Visit       Respiratory   Seasonal allergic rhinitis    The tightness in the throat that the patient experiencing seems to be related to allergic rhinitis. Inspection of her throat showed diffuse erythema and cobblestoning. Recommended to the patient that she restart fluticasone nasal spray and cetirizine. Consistent use of these medications should improve the inflammation caused by allergens.        Endocrine   Hypothyroidism    Maintain current dose of Synthroid, 50 mcg. Patient reports no hypothyroidism symptoms of fatigue, pallor, or hair loss at this time. Last TSH was 0.965 and T4 was 8.2, so within normal limits. There is no need to retest thyroid levels today.       Relevant Medications   SYNTHROID 50 MCG tablet     Other   Vitamin D deficiency  Vitamin D levels will be checked in lab today. Will continue with Vitamin D, 50,000 IU. Prescription sent to pharmacy.       Relevant Orders   VITAMIN D 25 Hydroxy (Vit-D Deficiency, Fractures)   HSV (herpes simplex virus) infection    Patient reports that she is currently taking Valacyclovir for genital HSV, but is requesting a cream to help with the itching symptoms that occur with outbreaks. Prescription for Lotrisone cream  sent to pharmacy.      Relevant Medications   valACYclovir (VALTREX) 500 MG tablet   clotrimazole-betamethasone (LOTRISONE) cream   Language barrier affecting health care    Had conversation with patient regarding the ways she could better access refills for her medications. Educated patient on accessing [facility] in [place].      Other Visit Diagnoses     Colon cancer screening    -  Primary   Relevant Orders   Fecal occult blood, imunochemical       Meds ordered this encounter  Medications   fluticasone (FLONASE) 50 MCG/ACT nasal spray    Sig: PLACE 2 SPRAYS INTO BOTH NOSTRILS DAILY.    Dispense:  16 g    Refill:  6   Vitamin D, Ergocalciferol, (DRISDOL) 1.25 MG (50000 UNIT) CAPS capsule    Sig: Take 1 capsule (50,000 Units total) by mouth every 7 (seven) days.    Dispense:  12 capsule    Refill:  3   SYNTHROID 50 MCG tablet    Sig: TAKE 1 TABLET (50 MCG TOTAL) BY MOUTH DAILY BEFORE BREAKFAST.    Dispense:  30 tablet    Refill:  5   cetirizine (ZYRTEC) 10 MG tablet    Sig: TAKE 1 TABLET (10 MG TOTAL) BY MOUTH DAILY.    Dispense:  30 tablet    Refill:  11   valACYclovir (VALTREX) 500 MG tablet    Sig: Take 1 tablet (500 mg total) by mouth 2 (two) times daily for 5 days.    Dispense:  10 tablet    Refill:  6    For future refills , has supply now   clotrimazole-betamethasone (LOTRISONE) cream    Sig: Apply 1 application topically 2 (two) times daily.    Dispense:  30 g    Refill:  0   38 minutes spent attaining history and physical navigating language barrier complex decision making multiple systems assessed Follow-up: Return in about 3 months (around 06/25/2021).    Asencion Noble, MD

## 2021-03-28 NOTE — Assessment & Plan Note (Addendum)
Patient reports that she is currently taking Valacyclovir for genital HSV, but is requesting a cream to help with the itching symptoms that occur with outbreaks. Prescription for Lotrisone cream sent to pharmacy.

## 2021-03-29 ENCOUNTER — Telehealth: Payer: Self-pay

## 2021-03-29 LAB — VITAMIN D 25 HYDROXY (VIT D DEFICIENCY, FRACTURES): Vit D, 25-Hydroxy: 72.8 ng/mL (ref 30.0–100.0)

## 2021-03-29 NOTE — Telephone Encounter (Signed)
-----   Message from Storm Frisk, MD sent at 03/29/2021  6:20 AM EST ----- Let pt know vit D level normal  stay on current dose of vitamin D

## 2021-03-29 NOTE — Telephone Encounter (Signed)
Pt was called and is aware of results, DOB was confirmed.   interpreter [id number]

## 2021-03-30 LAB — FECAL OCCULT BLOOD, IMMUNOCHEMICAL: Fecal Occult Bld: NEGATIVE

## 2021-04-01 ENCOUNTER — Other Ambulatory Visit: Payer: Self-pay

## 2021-04-01 ENCOUNTER — Telehealth: Payer: Self-pay

## 2021-04-01 NOTE — Telephone Encounter (Signed)
-----   Message from Elsie Stain, MD sent at 03/30/2021  6:19 AM EST ----- Let pt know fecal occult neg for colon cancer recheck in one year

## 2021-04-01 NOTE — Telephone Encounter (Signed)
Pt was called and is aware of results, DOB was confirmed.   interpreter [id number]

## 2021-04-09 ENCOUNTER — Other Ambulatory Visit: Payer: Self-pay

## 2021-04-10 ENCOUNTER — Other Ambulatory Visit: Payer: Self-pay

## 2021-05-07 ENCOUNTER — Other Ambulatory Visit: Payer: Self-pay

## 2021-05-10 ENCOUNTER — Other Ambulatory Visit: Payer: Self-pay

## 2021-05-21 ENCOUNTER — Other Ambulatory Visit: Payer: Self-pay | Admitting: Critical Care Medicine

## 2021-05-21 ENCOUNTER — Other Ambulatory Visit: Payer: Self-pay

## 2021-05-22 ENCOUNTER — Other Ambulatory Visit: Payer: Self-pay

## 2021-05-22 MED ORDER — CLOTRIMAZOLE-BETAMETHASONE 1-0.05 % EX CREA
1.0000 "application " | TOPICAL_CREAM | Freq: Two times a day (BID) | CUTANEOUS | 0 refills | Status: DC
  Filled 2021-05-22: qty 30, 15d supply, fill #0

## 2021-05-24 ENCOUNTER — Other Ambulatory Visit: Payer: Self-pay

## 2021-06-07 ENCOUNTER — Encounter: Payer: Self-pay | Admitting: Acute Care

## 2021-06-07 ENCOUNTER — Ambulatory Visit (INDEPENDENT_AMBULATORY_CARE_PROVIDER_SITE_OTHER): Payer: Self-pay | Admitting: Acute Care

## 2021-06-07 VITALS — BP 106/70 | HR 70 | Temp 98.1°F | Ht 62.0 in | Wt 131.2 lb

## 2021-06-07 DIAGNOSIS — R0683 Snoring: Secondary | ICD-10-CM

## 2021-06-07 DIAGNOSIS — R400 Somnolence: Secondary | ICD-10-CM

## 2021-06-07 NOTE — Progress Notes (Signed)
? ?History of Present Illness ?Veronica Jensen is a 46 y.o. female never smoker with snoring and concern for OSA. Additional past medical history includes Mild Asthma, Seasonal allergies, hypothyroidism. She has self referred to be formally evaluated for OSA.  ? ? ?06/07/2021 ?Pt. Presents for evaluation for sleep apnea. She is Spanish speaking. She is here with an interpreter. She is married , no children, and works as a Social worker. She states she does snore very loudly with sleep. She does not have headaches in the morning, but  she does feel tired upon awakening. She has had some night episodes where she awakens from a deep sleep gasping for air. She sleeps on her side, and occasionally on her back. She is unsure if the episodes of breathlessness occur more in any specific position. She did have one episode of gasping for air while she was on her back. Her father has sleep apnea. She has never been tested before. She has had an 8-10 pound weight loss recently  . She is in agreement with a Home Sleep Study to better evaluate .  ? ?She is a non-smoker ?She drinks alcohol once a year.  ?She has 2-3 night time awakenings per night ? ?Sleep questionnaire ?Symptoms-  Patient has sleep apnea, no current symptoms  ?Prior sleep study-  No ?Bedtime- 11 pm ?Time to fall asleep- 1 hour ?Nocturnal awakenings- 2-3 times a night  ?Out of bed/start of day- 6:30 am ?Weight changes- Weight loss of 8-10 pounds.  ?Do you operate heavy machinery- No ?Do you currently wear CPAP- No ?Do you current wear oxygen- NO ?Epworth-  6 ( Sitting and Reading>> 3, Lying Down in the afternoon to rest >> 1, Watching TV >> 2) ? ?Test Results: ? ? ?  Latest Ref Rng & Units 06/01/2019  ?  2:39 PM 06/08/2018  ?  4:49 PM 06/08/2018  ?  2:17 PM  ?CBC  ?WBC 3.4 - 10.8 x10E3/uL 5.3   7.8   7.1    ?Hemoglobin 11.1 - 15.9 g/dL 98.9   21.1   94.1    ?Hematocrit 34.0 - 46.6 % 37.5   38.5   38.0    ?Platelets 150 - 450 x10E3/uL 367   275   335    ? ? ? ?   Latest Ref Rng & Units 01/18/2020  ? 10:22 AM 06/01/2019  ?  2:39 PM 06/08/2018  ?  4:49 PM  ?BMP  ?Glucose 65 - 99 mg/dL 90   63   78    ?BUN 6 - 24 mg/dL 12   14   8     ?Creatinine 0.57 - 1.00 mg/dL   7.40   8.14    ?BUN/Creat Ratio 9 - 23 13   17      ?Sodium 134 - 144 mmol/L 142   140   138    ?Potassium 3.5 - 5.2 mmol/L 4.7   4.2   3.6    ?Chloride 96 - 106 mmol/L 105   103   106    ?CO2 20 - 29 mmol/L 22   24   22     ?Calcium 8.7 - 10.2 mg/dL 9.6   9.5   9.0    ? ? ?BNP ?No results found for: BNP ? ?ProBNP ?No results found for: PROBNP ? ?PFT ?No results found for: FEV1PRE, FEV1POST, FVCPRE, FVCPOST, TLC, DLCOUNC, PREFEV1FVCRT, PSTFEV1FVCRT ? ?No results found. ? ? ?Past medical hx ?Past Medical History:  ?Diagnosis Date  ?  Abdominal pain   ? Asthma in adult without complication   ? Chest pain   ? Chronic neck pain   ? Hyperthyroidism   ? Language barrier   ? Mood changes   ? Nausea   ? Ovarian cyst   ? Shortness of breath   ? Varicose veins of both lower extremities   ?  ? ?Social History  ? ?Tobacco Use  ? Smoking status: Never  ?  Passive exposure: Never  ? Smokeless tobacco: Never  ?Vaping Use  ? Vaping Use: Never used  ?Substance Use Topics  ? Alcohol use: Not Currently  ? Drug use: Not Currently  ? ? ?Ms.Veronica Jensen reports that she has never smoked. She has never been exposed to tobacco smoke. She has never used smokeless tobacco. She reports that she does not currently use alcohol. She reports that she does not currently use drugs. ? ?Tobacco Cessation: ?Never smoker ? ? ?Past surgical hx, Family hx, Social hx all reviewed. ? ?Current Outpatient Medications on File Prior to Visit  ?Medication Sig  ? clotrimazole-betamethasone (LOTRISONE) cream Apply 1 application topically 2 (two) times daily.  ? SYNTHROID 50 MCG tablet TAKE 1 TABLET (50 MCG TOTAL) BY MOUTH DAILY BEFORE BREAKFAST.  ? Vitamin D, Ergocalciferol, (DRISDOL) 1.25 MG (50000 UNIT) CAPS capsule Take 1 capsule (50,000 Units total) by  mouth every 7 (seven) days.  ? cetirizine (ZYRTEC) 10 MG tablet TAKE 1 TABLET (10 MG TOTAL) BY MOUTH DAILY. (Patient not taking: Reported on 06/07/2021)  ? fluticasone (FLONASE) 50 MCG/ACT nasal spray PLACE 2 SPRAYS INTO BOTH NOSTRILS DAILY. (Patient not taking: Reported on 06/07/2021)  ? [DISCONTINUED] nitroGLYCERIN (NITROSTAT) 0.4 MG SL tablet Place 1 tablet (0.4 mg total) under the tongue every 5 (five) minutes as needed for chest pain. (Patient not taking: No sig reported)  ? ?No current facility-administered medications on file prior to visit.  ?  ? ?Allergies  ?Allergen Reactions  ? Anesthesia S-I-40 [Propofol] Other (See Comments)  ?  Hypotension  ? ? ?Review Of Systems: ? ?Constitutional:   No  weight loss, night sweats,  Fevers, chills, + fatigue, or  lassitude. ? ?HEENT:   No headaches,  Difficulty swallowing,  Tooth/dental problems, or  Sore throat,  ?              No sneezing, itching, ear ache,+ nasal congestion, + post nasal drip,  ? ?CV:  No chest pain,  Orthopnea, PND, swelling in lower extremities, anasarca, dizziness, palpitations, syncope.  ? ?GI  No heartburn, indigestion, abdominal pain, nausea, vomiting, diarrhea, change in bowel habits, loss of appetite, bloody stools.  ? ?Resp: No shortness of breath with exertion or at rest.  No excess mucus, no productive cough,  No non-productive cough,  No coughing up of blood.  No change in color of mucus.  No wheezing.  No chest wall deformity ? ?Skin: no rash or lesions. ? ?GU: no dysuria, change in color of urine, no urgency or frequency.  No flank pain, no hematuria  ? ?MS:  No joint pain or swelling.  No decreased range of motion.  No back pain. ? ?Psych:  No change in mood or affect. No depression or anxiety.  No memory loss. ? ? ?Vital Signs ?BP 106/70 (BP Location: Left Arm, Patient Position: Sitting, Cuff Size: Normal)   Pulse 70   Temp 98.1 ?F (36.7 ?C) (Oral)   Ht 5\' 2"  (1.575 m)   Wt 131 lb 3.2 oz (59.5 kg)  SpO2 100%   BMI 24.00 kg/m?   ? ? ?Physical Exam: ? ?General- No distress,  A&Ox3, pleasant spanish speaking female ?ENT: No sinus tenderness, TM clear, pale nasal mucosa, no oral exudate,no post nasal drip, no LAN ?Cardiac: S1, S2, regular rate and rhythm, no murmur ?Chest: No wheeze/ rales/ dullness; no accessory muscle use, no nasal flaring, no sternal retractions ?Abd.: Soft Non-tender, ND, BS + ?Ext: No clubbing cyanosis, edema ?Neuro:  normal strength, MAE x 4, A&O x 3 ?Skin: No rashes, warm and dry, NO lesions  ?Psych: normal mood and behavior ? ? ?Assessment/Plan ? ?English ? ?Loud Snoring with suspected OSA ?Father has OSA ?Epwoth Score of 6 ?Plan ?It is nice to meet you today ?We will order a Home Sleep Study to evaluate for Sleep Apnea.  ?We will call you to get this scheduled.  ?We will ensure there is an interpreter to come with you that day. ?We will schedule a follow up after the study has been resulted.  ?If you do have sleep apnea, we will review options for treatment at that time.  ?Please contact office for sooner follow up if symptoms do not improve or worsen or seek emergency care   ? ?Spanish ? ?es Curator hoy ?Ordenaremos un estudio del sue?o en el hogar para evaluar la apnea del sue?o. ?Lo llamaremos para programarlo. ?Nos aseguraremos de que haya un int?rprete que lo acompa?e ese d?a. ?Programaremos un seguimiento despu?s de que el estudio haya resultado. ?Si tiene apnea del sue?o, revisaremos las opciones de tratamiento en ese momento. ?Comun?quese con la oficina para un seguimiento m?s r?pido si los s?ntomas no mejoran o empeoran o busque atenci?n de emergencia ? ?I spent 35 minutes dedicated to the care of this patient on the date of this encounter to include pre-visit review of records, face-to-face time with the patient , working with an interpreter, discussing conditions above, post visit ordering of testing, clinical documentation with the electronic health record, making appropriate referrals as  documented, and communicating necessary information to the patient's healthcare team.  ? ? ?Bevelyn Ngo, NP ?06/07/2021  10:18 AM ? ? ? ? ? ? ? ? ? ?

## 2021-06-07 NOTE — Progress Notes (Signed)
Reviewed and agree with assessment/plan. ? ? ?Coralyn Helling, MD ?[facility] Pulmonary/Critical Care ?06/07/2021, 10:34 AM ?Pager:  [phone] ? ?

## 2021-06-07 NOTE — Patient Instructions (Addendum)
It is nice to meet you today ?We will order a Home Sleep Study to evaluate for Sleep Apnea.  ?We will call you to get this scheduled.  ?We will ensure there is an interpreter to come with you that day. ?We will schedule a follow up after the study has been resulted.  ?If you do have sleep apnea, we will review options for treatment at that time.  ?Please contact office for sooner follow up if symptoms do not improve or worsen or seek emergency care   ? ?es un placer conocerte hoy ?Ordenaremos un estudio del sue?o en el hogar para evaluar la apnea del sue?o. ?Lo llamaremos para programarlo. ?Nos aseguraremos de que haya un int?rprete que lo acompa?e ese d?a. ?Programaremos un seguimiento despu?s de que el estudio haya resultado. ?Si tiene apnea del sue?o, revisaremos las opciones de tratamiento en ese momento. ?Comun?quese con la oficina para un seguimiento m?s r?pido si los s?ntomas no mejoran o empeoran o busque atenci?n de emergencia ?

## 2021-06-17 ENCOUNTER — Other Ambulatory Visit: Payer: Self-pay

## 2021-06-18 ENCOUNTER — Other Ambulatory Visit: Payer: Self-pay

## 2021-06-19 ENCOUNTER — Other Ambulatory Visit: Payer: Self-pay

## 2021-06-27 ENCOUNTER — Ambulatory Visit: Payer: No Typology Code available for payment source

## 2021-06-27 DIAGNOSIS — G4733 Obstructive sleep apnea (adult) (pediatric): Secondary | ICD-10-CM

## 2021-06-27 DIAGNOSIS — R0683 Snoring: Secondary | ICD-10-CM

## 2021-07-05 DIAGNOSIS — G4733 Obstructive sleep apnea (adult) (pediatric): Secondary | ICD-10-CM

## 2021-07-08 ENCOUNTER — Other Ambulatory Visit: Payer: Self-pay

## 2021-08-21 ENCOUNTER — Other Ambulatory Visit: Payer: Self-pay

## 2021-09-25 ENCOUNTER — Other Ambulatory Visit: Payer: Self-pay

## 2021-10-30 ENCOUNTER — Other Ambulatory Visit: Payer: Self-pay

## 2021-10-30 ENCOUNTER — Other Ambulatory Visit: Payer: Self-pay | Admitting: Critical Care Medicine

## 2021-10-30 DIAGNOSIS — E039 Hypothyroidism, unspecified: Secondary | ICD-10-CM

## 2021-10-30 MED ORDER — SYNTHROID 50 MCG PO TABS
ORAL_TABLET | ORAL | 0 refills | Status: DC
  Filled 2021-10-30: qty 30, 30d supply, fill #0

## 2021-11-01 ENCOUNTER — Other Ambulatory Visit: Payer: Self-pay

## 2021-11-29 ENCOUNTER — Other Ambulatory Visit: Payer: Self-pay | Admitting: Critical Care Medicine

## 2021-11-29 ENCOUNTER — Other Ambulatory Visit: Payer: Self-pay

## 2021-11-29 DIAGNOSIS — E039 Hypothyroidism, unspecified: Secondary | ICD-10-CM

## 2021-11-29 NOTE — Telephone Encounter (Signed)
Requested medication (s) are due for refill today: yes  Requested medication (s) are on the active medication list: yes  Last refill:  10/30/21  Future visit scheduled: no  Notes to clinic:  Unable to refill per protocol, courtesy refill already given, routing for provider approval.      Requested Prescriptions  Pending Prescriptions Disp Refills   SYNTHROID 50 MCG tablet 30 tablet 0    Sig: TAKE 1 TABLET (50 MCG TOTAL) BY MOUTH DAILY BEFORE BREAKFAST.     Endocrinology:  Hypothyroid Agents Passed - 11/29/2021  1:28 PM      Passed - TSH in normal range and within 360 days    TSH  Date Value Ref Range Status  12/19/2020 0.965 0.450 - 4.500 uIU/mL Final         Passed - Valid encounter within last 12 months    Recent Outpatient Visits           8 months ago Vitamin D deficiency   [place], [place]   11 months ago Vitamin D deficiency   [facility], [place], [place]   1 year ago Facial swelling   [place], Vernia Buff, NP   1 year ago Bilateral chronic serous otitis media   [place] Elsie Stain, MD   1 year ago Hypothyroidism, unspecified type   [place] Elsie Stain, MD

## 2021-12-02 ENCOUNTER — Other Ambulatory Visit: Payer: Self-pay

## 2021-12-31 ENCOUNTER — Ambulatory Visit: Payer: No Typology Code available for payment source | Attending: Family Medicine | Admitting: Family Medicine

## 2021-12-31 ENCOUNTER — Other Ambulatory Visit: Payer: Self-pay

## 2021-12-31 ENCOUNTER — Encounter: Payer: Self-pay | Admitting: Family Medicine

## 2021-12-31 VITALS — BP 106/70 | HR 78 | Temp 98.5°F | Ht 62.0 in | Wt 139.6 lb

## 2021-12-31 DIAGNOSIS — Z13228 Encounter for screening for other metabolic disorders: Secondary | ICD-10-CM

## 2021-12-31 DIAGNOSIS — Z8619 Personal history of other infectious and parasitic diseases: Secondary | ICD-10-CM

## 2021-12-31 DIAGNOSIS — J302 Other seasonal allergic rhinitis: Secondary | ICD-10-CM

## 2021-12-31 DIAGNOSIS — E039 Hypothyroidism, unspecified: Secondary | ICD-10-CM

## 2021-12-31 DIAGNOSIS — E559 Vitamin D deficiency, unspecified: Secondary | ICD-10-CM | POA: Diagnosis not present

## 2021-12-31 DIAGNOSIS — Z131 Encounter for screening for diabetes mellitus: Secondary | ICD-10-CM

## 2021-12-31 MED ORDER — ACYCLOVIR 5 % EX OINT
1.0000 | TOPICAL_OINTMENT | Freq: Four times a day (QID) | CUTANEOUS | 2 refills | Status: DC | PRN
  Filled 2021-12-31: qty 30, 30d supply, fill #0
  Filled 2022-04-30: qty 30, 30d supply, fill #1

## 2021-12-31 MED ORDER — CETIRIZINE HCL 10 MG PO TABS
10.0000 mg | ORAL_TABLET | Freq: Every day | ORAL | 3 refills | Status: DC
  Filled 2021-12-31: qty 30, 30d supply, fill #0
  Filled 2022-02-27: qty 30, 30d supply, fill #1

## 2021-12-31 NOTE — Progress Notes (Signed)
Swelling around eyes Check vit d levels.

## 2021-12-31 NOTE — Progress Notes (Signed)
Subjective:  Patient ID: Veronica Jensen, female    DOB: [birth year]  Age: 46 y.o. MRN: [id number]  CC: Hypothyroidism   HPI Veronica Jensen is a 46 y.o. year old female patient of Dr. Joya Gaskins with a history of hypothyroidism here for an office visit.  Interval History:  She would like her vitamin D level checked and requests a refill of her Synthroid. Last dose of Synthroid was this morning.   She is requesting a Prescription for Acyclovir cream for burning associated with herpes genitalis.  She states [place]  In the fall and spring she eats she usually has periorbital edema which she thinks might be related to allergies and is requesting something for this. Past Medical History:  Diagnosis Date   Abdominal pain    Asthma in adult without complication    Chest pain    Chronic neck pain    Hyperthyroidism    Language barrier    Mood changes    Nausea    Ovarian cyst    Shortness of breath    Varicose veins of both lower extremities     Past Surgical History:  Procedure Laterality Date   MYOMECTOMY      Family History  Problem Relation Age of Onset   Thyroid disease Mother    Hypercholesterolemia Father     Social History   Socioeconomic History   Marital status: Married    Spouse name: Not on file   Number of children: Not on file   Years of education: Not on file   Highest education level: Bachelor's degree (e.g., BA, AB, BS)  Occupational History   Not on file  Tobacco Use   Smoking status: Never    Passive exposure: Never   Smokeless tobacco: Never  Vaping Use   Vaping Use: Never used  Substance and Sexual Activity   Alcohol use: Not Currently   Drug use: Not Currently   Sexual activity: Not Currently    Birth control/protection: None  Other Topics Concern   Not on file  Social History Narrative   Not on file   Social Determinants of Health   Financial Resource Strain: Not  on file  Food Insecurity: No Food Insecurity (03/13/2020)   Hunger Vital Sign    Worried About Running Out of Food in the Last Year: Never true    Ran Out of Food in the Last Year: Never true  Transportation Needs: No Transportation Needs (03/13/2020)   PRAPARE - Hydrologist (Medical): No    Lack of Transportation (Non-Medical): No  Physical Activity: Not on file  Stress: Not on file  Social Connections: Not on file    Allergies  Allergen Reactions   Anesthesia S-I-40 [Propofol] Other (See Comments)    Hypotension    Outpatient Medications Prior to Visit  Medication Sig Dispense Refill   SYNTHROID 50 MCG tablet TAKE 1 TABLET (50 MCG TOTAL) BY MOUTH DAILY BEFORE BREAKFAST. 30 tablet 0   clotrimazole-betamethasone (LOTRISONE) cream Apply 1 application topically 2 (two) times daily. (Patient not taking: Reported on 12/31/2021) 30 g 0   fluticasone (FLONASE) 50 MCG/ACT nasal spray PLACE 2 SPRAYS INTO BOTH NOSTRILS DAILY. (Patient not taking: Reported on 06/07/2021) 16 g 6   Vitamin D, Ergocalciferol, (DRISDOL) 1.25 MG (50000 UNIT) CAPS capsule Take 1 capsule (50,000 Units total) by mouth every 7 (seven) days. (Patient not taking: Reported on 12/31/2021) 12 capsule 3  cetirizine (ZYRTEC) 10 MG tablet TAKE 1 TABLET (10 MG TOTAL) BY MOUTH DAILY. (Patient not taking: Reported on 06/07/2021) 30 tablet 11   No facility-administered medications prior to visit.     ROS Review of Systems  Constitutional:  Negative for activity change and appetite change.  HENT:  Negative for sinus pressure and sore throat.   Respiratory:  Negative for chest tightness, shortness of breath and wheezing.   Cardiovascular:  Negative for chest pain and palpitations.  Gastrointestinal:  Negative for abdominal distention, abdominal pain and constipation.  Genitourinary: Negative.   Musculoskeletal: Negative.   Psychiatric/Behavioral:  Negative for behavioral problems and dysphoric mood.      Objective:  BP 106/70   Pulse 78   Temp 98.5 F (36.9 C) (Oral)   Ht _0  (1.575 m)   Wt 139 lb 9.6 oz (63.3 kg)   SpO2 100%   BMI 25.53 kg/m      12/31/2021   10:05 AM 06/07/2021    9:48 AM 03/28/2021    8:43 AM  BP/Weight  Systolic BP 563 893 734  Diastolic BP 70 70 77  Wt. (Lbs) 139.6 131.2 137.2  BMI 25.53 kg/m2 24 kg/m2 25.09 kg/m2      Physical Exam Constitutional:      Appearance: She is well-developed.  Cardiovascular:     Rate and Rhythm: Normal rate.     Heart sounds: Normal heart sounds. No murmur heard. Pulmonary:     Effort: Pulmonary effort is normal.     Breath sounds: Normal breath sounds. No wheezing or rales.  Chest:     Chest wall: No tenderness.  Abdominal:     General: Bowel sounds are normal. There is no distension.     Palpations: Abdomen is soft. There is no mass.     Tenderness: There is no abdominal tenderness.  Musculoskeletal:        General: Normal range of motion.     Right lower leg: No edema.     Left lower leg: No edema.  Neurological:     Mental Status: She is alert and oriented to person, place, and time.  Psychiatric:        Mood and Affect: Mood normal.        Latest Ref Rng & Units 01/18/2020   10:22 AM 06/01/2019    2:39 PM 06/08/2018    4:49 PM  CMP  Glucose 65 - 99 mg/dL 90  63  78   BUN 6 - 24 mg/dL _1 Creatinine 0.57 - 1.00 mg/dL 0.91  0.83  0.71   Sodium 134 - 144 mmol/L 142  140  138   Potassium 3.5 - 5.2 mmol/L 4.7  4.2  3.6   Chloride 96 - 106 mmol/L 105  103  106   CO2 20 - 29 mmol/L _2 Calcium 8.7 - 10.2 mg/dL 9.6  9.5  9.0   Total Protein 6.0 - 8.5 g/dL 7.2  6.7    Total Bilirubin 0.0 - 1.2 mg/dL 0.7  0.4    Alkaline Phos 44 - 121 IU/L 67  66    AST 0 - 40 IU/L 16  21    ALT 0 - 32 IU/L 11  10      Lipid Panel     Component Value Date/Time   CHOL 173 06/01/2019 1439   TRIG 76 06/01/2019 1439   HDL 59 06/01/2019 1439   CHOLHDL 2.9  06/01/2019 1439   LDLCALC 100 (H)  06/01/2019 1439    CBC    Component Value Date/Time   WBC 5.3 06/01/2019 1439   WBC 7.8 06/08/2018 1649   RBC 4.57 06/01/2019 1439   RBC 4.62 06/08/2018 1649   HGB 12.1 06/01/2019 1439   HCT 37.5 06/01/2019 1439   PLT 367 06/01/2019 1439   MCV 82 06/01/2019 1439   MCH 26.5 (L) 06/01/2019 1439   MCH 26.8 06/08/2018 1649   MCHC 32.3 06/01/2019 1439   MCHC 32.2 06/08/2018 1649   RDW 12.8 06/01/2019 1439   LYMPHSABS 1.8 06/01/2019 1439   EOSABS 0.1 06/01/2019 1439   BASOSABS 0.1 06/01/2019 1439    Lab Results  Component Value Date   HGBA1C 5.4 06/01/2019    Lab Results  Component Value Date   TSH 0.965 12/19/2020    Assessment & Plan:  1. Vitamin D deficiency - VITAMIN D 25 Hydroxy (Vit-D Deficiency, Fractures)  2. Hypothyroidism, unspecified type Controlled Endorses adherence with Synthroid She is due for thyroid panel check - T4, free - TSH - T3  3. Screening for diabetes mellitus - Hemoglobin A1c  4. Screening for metabolic disorder - ZUD67+QVHQ - LP+Non-HDL Cholesterol - CBC with Differential/Platelet  5. Seasonal allergies Could explain her periorbital edema - cetirizine (ZYRTEC) 10 MG tablet; TAKE 1 TABLET (10 MG TOTAL) BY MOUTH DAILY.  Dispense: 30 tablet; Refill: 3  6. History of herpes genitalis Advised that oral acyclovir is recommended however she states that she has always been on the cream and prefers the cream - acyclovir ointment (ZOVIRAX) 5 %; Apply 1 Application topically every 6 (six) hours as needed.  Dispense: 30 g; Refill: 2    Meds ordered this encounter  Medications   acyclovir ointment (ZOVIRAX) 5 %    Sig: Apply 1 Application topically every 6 (six) hours as needed.    Dispense:  30 g    Refill:  2   cetirizine (ZYRTEC) 10 MG tablet    Sig: TAKE 1 TABLET (10 MG TOTAL) BY MOUTH DAILY.    Dispense:  30 tablet    Refill:  3    Follow-up: Return in about 3 months (around 04/02/2022) for Medical conditions with PCP.        Charlott Rakes, MD, FAAFP. [facility] [place] [place], [place]   12/31/2021, 10:33 AM

## 2021-12-31 NOTE — Patient Instructions (Addendum)
Hacer ejercicio para mantenerse sano Exercising to Stay Healthy Para estar sano y mantenerse as, es recomendable hacer ejercicio de intensidad moderada y de intensidad vigorosa. Puede saber si est haciendo ejercicio de intensidad moderada si su corazn comienza a latir ms rpido y su respiracin se vuelve ms rpida, pero an puede mantener una conversacin. Puede saber que est haciendo ejercicio de intensidad vigorosa si respira con mucha ms dificultad y rapidez, y no puede mantener una conversacin. Cmo puede beneficiarme el ejercicio? Hacer actividad fsica con regularidad es muy importante. Tiene muchos otros beneficios, como por ejemplo: Mejora el estado fsico general, la flexibilidad y la resistencia. Aumenta la densidad sea. Ayuda a controlar el peso. Disminuye la grasa corporal. Aumenta la fuerza y la resistencia muscular. Reduce el estrs y la tensin, la ansiedad, la depresin o la ira. Mejora el estado de salud general. Qu pautas debo seguir mientras hago ejercicio? Hable con el mdico antes de comenzar un programa nuevo de actividad fsica. No haga ejercicio en exceso que pudiera hacer que se lastime, se sienta mareado o tenga dificultad para respirar. Use ropa cmoda y calzado con buen soporte. Beba gran cantidad de agua mientras hace ejercicio para evitar la deshidratacin o los golpes de calor. Haga ejercicio hasta que se aceleren su respiracin y sus latidos cardacos (intensidad moderada). Con qu frecuencia debera hacer ejercicio? Elija una actividad que disfrute y establezca objetivos realistas. El mdico puede ayudarlo a elaborar un plan de actividades, diseado de manera individual y que funcione mejor para usted. Haga actividad fsica habitualmente como se lo haya indicado el mdico. Esto puede incluir: Hacer ejercicios de fortalecimiento muscular dos veces a la semana, como: Levantamiento de pesas. Uso de bandas elsticas de resistencia. Flexiones de  brazos. Abdominales. Yoga. Realizar ejercicio de una cierta intensidad durante una cantidad determinada de tiempo. Elija entre estas opciones: Un total de 150 minutos de ejercicio de intensidad moderada cada semana. Un total de 75 minutos de ejercicio de intensidad vigorosa cada semana. Una mezcla de ejercicio de intensidad moderada y vigorosa cada semana. Los nios, las mujeres embarazadas, las personas que no han hecho actividad fsica con regularidad, las personas que tienen sobrepeso y los adultos mayores tal vez tengan que consultar a un mdico sobre qu actividades son seguras para realizar. Si tiene alguna afeccin, asegrese de consultar al mdico antes de comenzar un programa de ejercicios nuevo. Cules son algunas ideas para hacer ejercicio? Algunas ideas de ejercicio de intensidad moderada incluyen: Caminar 1 milla (1.6 km) en aproximadamente 15 minutos. Andar en bicicleta. Practicar senderismo. Jugar al golf. Bailar. Gimnasia acutica. Algunas ideas de ejercicio de intensidad vigorosa incluyen: Caminar 4.5 millas (7.2 km) o ms en aproximadamente una hora. Trotar o correr 5 millas (8 km) en aproximadamente una hora. Andar en bicicleta 10 millas (16.1 km) o ms en aproximadamente una hora. Practicar natacin. Practicar patinaje de ruedas o en lnea. Hacer esqu de fondo. Hacer deportes competitivos vigorosos, como ftbol americano, bsquet y ftbol. Saltar la cuerda. Tomar clases de baile aerbico. Cules son algunas actividades diarias que pueden ayudarme a hacer ejercicio? Trabajar en el jardn, como: Empujar una cortadora de csped. Juntar y embolsar hojas. Lavar el automvil. Empujar un cochecito. Palear nieve. Cuidar el jardn. Lavar las ventanas o los pisos. Cmo puedo ser ms activo en mis actividades diarias? Utilice las escaleras en lugar del ascensor. Vaya a caminar durante su hora de almuerzo. Si conduce, estacione el automvil ms lejos del trabajo o de  la escuela. Si usa transporte   pblico, bjese una parada antes y camine el resto del camino. Pngase de pie o camine durante todas las llamadas telefnicas que haga mientras est adentro. Levntese, estrese y camine cada 30 minutos a lo largo del da. Haga ejercicio con un amigo. El apoyo para continuar haciendo ejercicio lo ayudar a mantener una rutina de actividad frecuente. Dnde obtener ms informacin Puede obtener ms informacin acerca de cmo hacer actividad fsica para mantenerse sano en: U.S. Department of Health and Human Services (Departamento de Salud y Servicios Humanos de los [place] Un[place]): www.hhs.gov Centers for Disease Control and Prevention (CDC) (Centros para el Control y la Prevencin de Enfermedades): www.cdc.gov Resumen Hacer actividad fsica con regularidad es muy importante. Mejorar el estado fsico general, la flexibilidad y la resistencia. El ejercicio regular tambin mejorar la salud general. Puede ayudarlo a controlar su peso, reducir el estrs y mejorar la densidad sea. No haga ejercicio en exceso que pudiera hacer que se lastime, se sienta mareado o tenga dificultad para respirar. Hable con el mdico antes de comenzar un programa nuevo de actividad fsica. Esta informacin no tiene como fin reemplazar el consejo del mdico. Asegrese de hacerle al mdico cualquier pregunta que tenga. Document Revised: 06/14/2020 Document Reviewed: 06/14/2020 Elsevier Patient Education  2023 Elsevier Inc.  

## 2022-01-01 ENCOUNTER — Other Ambulatory Visit: Payer: Self-pay

## 2022-01-01 ENCOUNTER — Other Ambulatory Visit: Payer: Self-pay | Admitting: Family Medicine

## 2022-01-01 DIAGNOSIS — E039 Hypothyroidism, unspecified: Secondary | ICD-10-CM

## 2022-01-01 LAB — HEMOGLOBIN A1C
Est. average glucose Bld gHb Est-mCnc: 111 mg/dL
Hgb A1c MFr Bld: 5.5 % (ref 4.8–5.6)

## 2022-01-01 LAB — LP+NON-HDL CHOLESTEROL
Cholesterol, Total: 195 mg/dL (ref 100–199)
HDL: 68 mg/dL (ref >39)
LDL Chol Calc (NIH): 115 mg/dL — ABNORMAL HIGH (ref 0–99)
Total Non-HDL-Chol (LDL+VLDL): 127 mg/dL (ref 0–129)
Triglycerides: 66 mg/dL (ref 0–149)
VLDL Cholesterol Cal: 12 mg/dL (ref 5–40)

## 2022-01-01 LAB — CMP14+EGFR
ALT: 13 IU/L (ref 0–32)
AST: 17 IU/L (ref 0–40)
Albumin/Globulin Ratio: 2 (ref 1.2–2.2)
Albumin: 4.5 g/dL (ref 3.9–4.9)
Alkaline Phosphatase: 61 IU/L (ref 44–121)
BUN/Creatinine Ratio: 16 (ref 9–23)
BUN: 14 mg/dL (ref 6–24)
Bilirubin Total: 0.5 mg/dL (ref 0.0–1.2)
CO2: 22 mmol/L (ref 20–29)
Calcium: 9.3 mg/dL (ref 8.7–10.2)
Chloride: 101 mmol/L (ref 96–106)
Creatinine, Ser: 0.88 mg/dL (ref 0.57–1.00)
Globulin, Total: 2.2 g/dL (ref 1.5–4.5)
Glucose: 100 mg/dL — ABNORMAL HIGH (ref 70–99)
Potassium: 4.1 mmol/L (ref 3.5–5.2)
Sodium: 138 mmol/L (ref 134–144)
Total Protein: 6.7 g/dL (ref 6.0–8.5)
eGFR: 82 mL/min/{1.73_m2} (ref >59)

## 2022-01-01 LAB — CBC WITH DIFFERENTIAL/PLATELET
Basophils Absolute: 0.1 10*3/uL (ref 0.0–0.2)
Basos: 1 %
EOS (ABSOLUTE): 0.2 10*3/uL (ref 0.0–0.4)
Eos: 3 %
Hematocrit: 39.4 % (ref 34.0–46.6)
Hemoglobin: 13.1 g/dL (ref 11.1–15.9)
Immature Grans (Abs): 0 10*3/uL (ref 0.0–0.1)
Immature Granulocytes: 0 %
Lymphocytes Absolute: 2 10*3/uL (ref 0.7–3.1)
Lymphs: 37 %
MCH: 26.4 pg — ABNORMAL LOW (ref 26.6–33.0)
MCHC: 33.2 g/dL (ref 31.5–35.7)
MCV: 79 fL (ref 79–97)
Monocytes Absolute: 0.4 10*3/uL (ref 0.1–0.9)
Monocytes: 7 %
Neutrophils Absolute: 2.9 10*3/uL (ref 1.4–7.0)
Neutrophils: 52 %
Platelets: 289 10*3/uL (ref 150–450)
RBC: 4.96 x10E6/uL (ref 3.77–5.28)
RDW: 13.4 % (ref 11.7–15.4)
WBC: 5.5 10*3/uL (ref 3.4–10.8)

## 2022-01-01 LAB — TSH: TSH: 2.61 u[IU]/mL (ref 0.450–4.500)

## 2022-01-01 LAB — T4, FREE: Free T4: 1.27 ng/dL (ref 0.82–1.77)

## 2022-01-01 LAB — VITAMIN D 25 HYDROXY (VIT D DEFICIENCY, FRACTURES): Vit D, 25-Hydroxy: 34.1 ng/mL (ref 30.0–100.0)

## 2022-01-01 LAB — T3: T3, Total: 91 ng/dL (ref 71–180)

## 2022-01-01 MED ORDER — SYNTHROID 50 MCG PO TABS
50.0000 ug | ORAL_TABLET | Freq: Every day | ORAL | 1 refills | Status: DC
  Filled 2022-01-01: qty 30, 30d supply, fill #0
  Filled 2022-01-28: qty 30, 30d supply, fill #1
  Filled 2022-02-27: qty 30, 30d supply, fill #2
  Filled 2022-04-01: qty 30, 30d supply, fill #3
  Filled 2022-04-30: qty 30, 30d supply, fill #4
  Filled 2022-06-02: qty 30, 30d supply, fill #5

## 2022-01-03 ENCOUNTER — Other Ambulatory Visit: Payer: Self-pay

## 2022-01-07 ENCOUNTER — Ambulatory Visit: Payer: No Typology Code available for payment source

## 2022-01-14 ENCOUNTER — Ambulatory Visit: Payer: No Typology Code available for payment source

## 2022-01-21 ENCOUNTER — Ambulatory Visit: Payer: No Typology Code available for payment source

## 2022-01-28 ENCOUNTER — Other Ambulatory Visit: Payer: Self-pay

## 2022-01-28 ENCOUNTER — Ambulatory Visit: Payer: No Typology Code available for payment source

## 2022-01-29 ENCOUNTER — Other Ambulatory Visit: Payer: Self-pay

## 2022-02-04 ENCOUNTER — Ambulatory Visit: Payer: No Typology Code available for payment source

## 2022-02-27 ENCOUNTER — Other Ambulatory Visit: Payer: Self-pay

## 2022-02-28 ENCOUNTER — Other Ambulatory Visit (HOSPITAL_COMMUNITY): Payer: Self-pay

## 2022-02-28 ENCOUNTER — Other Ambulatory Visit: Payer: Self-pay

## 2022-03-03 ENCOUNTER — Other Ambulatory Visit: Payer: Self-pay

## 2022-03-03 ENCOUNTER — Other Ambulatory Visit (HOSPITAL_COMMUNITY): Payer: Self-pay

## 2022-04-01 ENCOUNTER — Other Ambulatory Visit: Payer: Self-pay | Admitting: Critical Care Medicine

## 2022-04-01 ENCOUNTER — Other Ambulatory Visit: Payer: Self-pay

## 2022-04-07 NOTE — Progress Notes (Deleted)
Subjective:  Patient ID: Veronica Jensen, female    DOB: 1975-05-26  Age: 47 y.o. MRN: [id number]  CC: No chief complaint on file.   HPI Not seen since 03/2021 by Joya Gaskins  Dr Margarita Rana saw patient 12/2021: Veronica Jensen is a 47 y.o. year old female patient of Dr. Joya Gaskins with a history of hypothyroidism here for an office visit.  Interval History:  She would like her vitamin D level checked and requests a refill of her Synthroid. Last dose of Synthroid was this morning.   She is requesting a Prescription for Acyclovir cream for burning associated with herpes genitalis.  She [place]  In the fall and spring she eats she usually has periorbital edema which she thinks might be related to allergies and is requesting something for this. 1. Vitamin D deficiency - VITAMIN D 25 Hydroxy (Vit-D Deficiency, Fractures)  2. Hypothyroidism, unspecified type Controlled Endorses adherence with Synthroid She is due for thyroid panel check - T4, free - TSH - T3  3. Screening for diabetes mellitus - Hemoglobin A1c  4. Screening for metabolic disorder - 0000000 - LP+Non-HDL Cholesterol - CBC with Differential/Platelet  5. Seasonal allergies Could explain her periorbital edema - cetirizine (ZYRTEC) 10 MG tablet; TAKE 1 TABLET (10 MG TOTAL) BY MOUTH DAILY.  Dispense: 30 tablet; Refill: 3  6. History of herpes genitalis Advised that oral acyclovir is recommended however she states that she has always been on the cream and prefers the cream - acyclovir ointment (ZOVIRAX) 5 %; Apply 1 Application topically every 6 (six) hours as needed.  Dispense: 30 g; Refill: 2 04/08/22  Past Medical History:  Diagnosis Date   Abdominal pain    Asthma in adult without complication    Chest pain    Chronic neck pain    Hyperthyroidism    Language barrier    Mood changes    Nausea    Ovarian cyst    Shortness of breath     Varicose veins of both lower extremities     Past Surgical History:  Procedure Laterality Date   MYOMECTOMY      Family History  Problem Relation Age of Onset   Thyroid disease Mother    Hypercholesterolemia Father     Social History   Socioeconomic History   Marital status: Married    Spouse name: Not on file   Number of children: Not on file   Years of education: Not on file   Highest education level: Bachelor's degree (e.g., BA, AB, BS)  Occupational History   Not on file  Tobacco Use   Smoking status: Never    Passive exposure: Never   Smokeless tobacco: Never  Vaping Use   Vaping Use: Never used  Substance and Sexual Activity   Alcohol use: Not Currently   Drug use: Not Currently   Sexual activity: Not Currently    Birth control/protection: None  Other Topics Concern   Not on file  Social History Narrative   Not on file   Social Determinants of Health   Financial Resource Strain: Not on file  Food Insecurity: No Food Insecurity (03/13/2020)   Hunger Vital Sign    Worried About Running Out of Food in the Last Year: Never true    Ran Out of Food in the Last Year: Never true  Transportation Needs: No Transportation Needs (03/13/2020)   PRAPARE - Hydrologist (Medical):  No    Lack of Transportation (Non-Medical): No  Physical Activity: Not on file  Stress: Not on file  Social Connections: Not on file    Allergies  Allergen Reactions   Anesthesia S-I-40 [Propofol] Other (See Comments)    Hypotension    Outpatient Medications Prior to Visit  Medication Sig Dispense Refill   acyclovir ointment (ZOVIRAX) 5 % Apply 1 Application topically every 6 (six) hours as needed. 30 g 2   cetirizine (ZYRTEC) 10 MG tablet Take 1 tablet (10 mg total) by mouth daily. 30 tablet 3   clotrimazole-betamethasone (LOTRISONE) cream Apply 1 application topically 2 (two) times daily. (Patient not taking: Reported on 12/31/2021) 30 g 0   fluticasone  (FLONASE) 50 MCG/ACT nasal spray PLACE 2 SPRAYS INTO BOTH NOSTRILS DAILY. (Patient not taking: Reported on 06/07/2021) 16 g 6   SYNTHROID 50 MCG tablet Take 1 tablet (50 mcg total) by mouth daily before breakfast. 90 tablet 1   Vitamin D, Ergocalciferol, (DRISDOL) 1.25 MG (50000 UNIT) CAPS capsule Take 1 capsule (50,000 Units total) by mouth every 7 (seven) days. (Patient not taking: Reported on 12/31/2021) 12 capsule 3   No facility-administered medications prior to visit.     ROS Review of Systems  Constitutional:  Negative for activity change and appetite change.  HENT:  Negative for sinus pressure and sore throat.   Respiratory:  Negative for chest tightness, shortness of breath and wheezing.   Cardiovascular:  Negative for chest pain and palpitations.  Gastrointestinal:  Negative for abdominal distention, abdominal pain and constipation.  Genitourinary: Negative.   Musculoskeletal: Negative.   Psychiatric/Behavioral:  Negative for behavioral problems and dysphoric mood.     Objective:  There were no vitals taken for this visit.     12/31/2021   10:05 AM 06/07/2021    9:48 AM 03/28/2021    8:43 AM  BP/Weight  Systolic BP [id number]  Diastolic BP 70 70 77  Wt. (Lbs) 139.6 131.2 137.2  BMI 25.53 kg/m2 24 kg/m2 25.09 kg/m2      Physical Exam Constitutional:      Appearance: She is well-developed.  Cardiovascular:     Rate and Rhythm: Normal rate.     Heart sounds: Normal heart sounds. No murmur heard. Pulmonary:     Effort: Pulmonary effort is normal.     Breath sounds: Normal breath sounds. No wheezing or rales.  Chest:     Chest wall: No tenderness.  Abdominal:     General: Bowel sounds are normal. There is no distension.     Palpations: Abdomen is soft. There is no mass.     Tenderness: There is no abdominal tenderness.  Musculoskeletal:        General: Normal range of motion.     Right lower leg: No edema.     Left lower leg: No edema.  Neurological:      Mental Status: She is alert and oriented to person, place, and time.  Psychiatric:        Mood and Affect: Mood normal.        Latest Ref Rng & Units 12/31/2021   10:43 AM 01/18/2020   10:22 AM 06/01/2019    2:39 PM  CMP  Glucose 70 - 99 mg/dL 100  90  63   BUN 6 - 24 mg/dL 14  12  14   $ Creatinine 0.57 - 1.00 mg/dL 0.88  0.91  0.83   Sodium 134 - 144 mmol/L 138  142  140  Potassium 3.5 - 5.2 mmol/L 4.1  4.7  4.2   Chloride 96 - 106 mmol/L 101  105  103   CO2 20 - 29 mmol/L 22  22  24   $ Calcium 8.7 - 10.2 mg/dL 9.3  9.6  9.5   Total Protein 6.0 - 8.5 g/dL 6.7  7.2  6.7   Total Bilirubin 0.0 - 1.2 mg/dL 0.5  0.7  0.4   Alkaline Phos 44 - 121 IU/L 61  67  66   AST 0 - 40 IU/L 17  16  21   $ ALT 0 - 32 IU/L 13  11  10     $ Lipid Panel     Component Value Date/Time   CHOL 195 12/31/2021 1043   TRIG 66 12/31/2021 1043   HDL 68 12/31/2021 1043   CHOLHDL 2.9 06/01/2019 1439   LDLCALC 115 (H) 12/31/2021 1043    CBC    Component Value Date/Time   WBC 5.5 12/31/2021 1043   WBC 7.8 06/08/2018 1649   RBC 4.96 12/31/2021 1043   RBC 4.62 06/08/2018 1649   HGB 13.1 12/31/2021 1043   HCT 39.4 12/31/2021 1043   PLT 289 12/31/2021 1043   MCV 79 12/31/2021 1043   MCH 26.4 (L) 12/31/2021 1043   MCH 26.8 06/08/2018 1649   MCHC 33.2 12/31/2021 1043   MCHC 32.2 06/08/2018 1649   RDW 13.4 12/31/2021 1043   LYMPHSABS 2.0 12/31/2021 1043   EOSABS 0.2 12/31/2021 1043   BASOSABS 0.1 12/31/2021 1043    Lab Results  Component Value Date   HGBA1C 5.5 12/31/2021    Lab Results  Component Value Date   TSH 2.610 12/31/2021    Assessment & Plan:     No orders of the defined types were placed in this encounter.   Follow-up: No follow-ups on file.       Asencion Noble, MD, FAAFP. [facility] [place], [place]   04/07/2022, 7:30 AM

## 2022-04-08 ENCOUNTER — Ambulatory Visit: Payer: No Typology Code available for payment source | Admitting: Critical Care Medicine

## 2022-04-30 ENCOUNTER — Other Ambulatory Visit: Payer: Self-pay

## 2022-05-01 ENCOUNTER — Other Ambulatory Visit: Payer: Self-pay

## 2022-05-05 ENCOUNTER — Other Ambulatory Visit: Payer: Self-pay

## 2022-05-05 MED ORDER — VALACYCLOVIR HCL 500 MG PO TABS
500.0000 mg | ORAL_TABLET | Freq: Two times a day (BID) | ORAL | 0 refills | Status: DC
  Filled 2022-05-05: qty 10, 5d supply, fill #0

## 2022-06-02 ENCOUNTER — Other Ambulatory Visit: Payer: Self-pay

## 2022-06-16 ENCOUNTER — Other Ambulatory Visit: Payer: Self-pay | Admitting: Critical Care Medicine

## 2022-06-16 ENCOUNTER — Other Ambulatory Visit: Payer: Self-pay

## 2022-06-17 ENCOUNTER — Other Ambulatory Visit: Payer: Self-pay

## 2022-06-17 ENCOUNTER — Other Ambulatory Visit: Payer: Self-pay | Admitting: Critical Care Medicine

## 2022-06-17 MED ORDER — FLUTICASONE PROPIONATE 50 MCG/ACT NA SUSP
2.0000 | Freq: Every day | NASAL | 0 refills | Status: DC
  Filled 2022-06-17: qty 16, 30d supply, fill #0

## 2022-06-17 NOTE — Telephone Encounter (Signed)
Requested medication (s) are due for refill today: Yes  Requested medication (s) are on the active medication list: Yes  Last refill:  03/28/21  Future visit scheduled: Yes  Notes to clinic:  Prescription expired.    Requested Prescriptions  Pending Prescriptions Disp Refills   fluticasone (FLONASE) 50 MCG/ACT nasal spray 16 g 6    Sig: PLACE 2 SPRAYS INTO BOTH NOSTRILS DAILY.     Ear, Nose, and Throat: Nasal Preparations - Corticosteroids Passed - 06/16/2022 11:11 AM      Passed - Valid encounter within last 12 months    Recent Outpatient Visits           5 months ago Vitamin D deficiency   [facility] Hoy Register, MD   1 year ago Vitamin D deficiency   [facility] Storm Frisk, MD   1 year ago Vitamin D deficiency   [facility] [place], Marzella Schlein, [place]   2 years ago Facial swelling   [facility] [place], [place] W, NP   2 years ago Bilateral chronic serous otitis media   [facility] Storm Frisk, MD       Future Appointments             In 4 weeks Storm Frisk, MD [facility]

## 2022-06-18 ENCOUNTER — Other Ambulatory Visit: Payer: Self-pay

## 2022-06-18 NOTE — Telephone Encounter (Signed)
Requested medication (s) are due for refill today - yes  Requested medication (s) are on the active medication list -yes  Future visit scheduled - yes  Last refill: -03/13/20  Notes to clinic: expired Rx, outside provider  Requested Prescriptions  Pending Prescriptions Disp Refills   valACYclovir (VALTREX) 500 MG tablet 10 tablet 5    Sig: TAKE 1 TABLET (500 MG TOTAL) BY MOUTH 2 (TWO) TIMES DAILY FOR 5 DAYS.     Antimicrobials:  Antiviral Agents - Anti-Herpetic Passed - 06/17/2022  1:08 PM      Passed - Valid encounter within last 12 months    Recent Outpatient Visits           5 months ago Vitamin D deficiency   [facility] Hoy Register, MD   1 year ago Vitamin D deficiency   [facility] Storm Frisk, MD   1 year ago Vitamin D deficiency   [place] [place], Marzella Schlein, [place]   2 years ago Facial swelling   [place] [place], [place] W, NP   2 years ago Bilateral chronic serous otitis media   [place] [facility] Storm Frisk, MD       Future Appointments             In 4 weeks Storm Frisk, MD [place] [facility]               Requested Prescriptions  Pending Prescriptions Disp Refills   valACYclovir (VALTREX) 500 MG tablet 10 tablet 5    Sig: TAKE 1 TABLET (500 MG TOTAL) BY MOUTH 2 (TWO) TIMES DAILY FOR 5 DAYS.     Antimicrobials:  Antiviral Agents - Anti-Herpetic Passed - 06/17/2022  1:08 PM      Passed - Valid encounter within last 12 months    Recent Outpatient Visits           5 months ago Vitamin D deficiency    [facility] Hoy Register, MD   1 year ago Vitamin D deficiency   [facility] Storm Frisk, MD   1 year ago Vitamin D deficiency   [facility] [place], Marzella [place]lein, [place]   2 years ago Facial swelling   [facility] [place], [place] W, NP   2 years ago Bilateral chronic serous otitis media   [facility] Storm Frisk, MD       Future Appointments             In 4 weeks Storm Frisk, MD [facility]

## 2022-07-02 ENCOUNTER — Other Ambulatory Visit: Payer: Self-pay

## 2022-07-02 ENCOUNTER — Other Ambulatory Visit: Payer: Self-pay | Admitting: Family Medicine

## 2022-07-02 DIAGNOSIS — E039 Hypothyroidism, unspecified: Secondary | ICD-10-CM

## 2022-07-02 MED ORDER — SYNTHROID 50 MCG PO TABS
50.0000 ug | ORAL_TABLET | Freq: Every day | ORAL | 0 refills | Status: DC
Start: 2022-07-02 — End: 2022-07-16
  Filled 2022-07-02: qty 30, 30d supply, fill #0

## 2022-07-16 ENCOUNTER — Encounter: Payer: Self-pay | Admitting: Critical Care Medicine

## 2022-07-16 ENCOUNTER — Ambulatory Visit
Payer: No Typology Code available for payment source | Attending: Critical Care Medicine | Admitting: Critical Care Medicine

## 2022-07-16 ENCOUNTER — Other Ambulatory Visit: Payer: Self-pay

## 2022-07-16 VITALS — BP 104/68 | HR 53 | Temp 98.5°F | Ht 62.0 in | Wt 143.0 lb

## 2022-07-16 DIAGNOSIS — E559 Vitamin D deficiency, unspecified: Secondary | ICD-10-CM | POA: Diagnosis not present

## 2022-07-16 DIAGNOSIS — J302 Other seasonal allergic rhinitis: Secondary | ICD-10-CM

## 2022-07-16 DIAGNOSIS — E039 Hypothyroidism, unspecified: Secondary | ICD-10-CM

## 2022-07-16 DIAGNOSIS — Z3009 Encounter for other general counseling and advice on contraception: Secondary | ICD-10-CM

## 2022-07-16 DIAGNOSIS — Z8619 Personal history of other infectious and parasitic diseases: Secondary | ICD-10-CM | POA: Diagnosis not present

## 2022-07-16 DIAGNOSIS — R600 Localized edema: Secondary | ICD-10-CM

## 2022-07-16 DIAGNOSIS — Z139 Encounter for screening, unspecified: Secondary | ICD-10-CM

## 2022-07-16 DIAGNOSIS — B009 Herpesviral infection, unspecified: Secondary | ICD-10-CM

## 2022-07-16 MED ORDER — VALACYCLOVIR HCL 500 MG PO TABS
500.0000 mg | ORAL_TABLET | Freq: Two times a day (BID) | ORAL | 0 refills | Status: DC
  Filled 2022-07-16 (×2): qty 10, 5d supply, fill #0

## 2022-07-16 MED ORDER — SYNTHROID 50 MCG PO TABS
50.0000 ug | ORAL_TABLET | Freq: Every day | ORAL | 0 refills | Status: DC
Start: 2022-07-16 — End: 2022-08-27
  Filled 2022-07-16 – 2022-07-28 (×3): qty 30, 30d supply, fill #0

## 2022-07-16 MED ORDER — NORETHINDRONE 0.35 MG PO TABS
1.0000 | ORAL_TABLET | Freq: Every day | ORAL | 11 refills | Status: DC
  Filled 2022-07-16: qty 28, 28d supply, fill #0

## 2022-07-16 MED ORDER — CETIRIZINE HCL 10 MG PO TABS
10.0000 mg | ORAL_TABLET | Freq: Every day | ORAL | 3 refills | Status: DC
Start: 2022-07-16 — End: 2023-02-24
  Filled 2022-07-16 (×2): qty 30, 30d supply, fill #0
  Filled 2022-08-27: qty 30, 30d supply, fill #1

## 2022-07-16 MED ORDER — ACYCLOVIR 5 % EX OINT
1.0000 | TOPICAL_OINTMENT | Freq: Four times a day (QID) | CUTANEOUS | 2 refills | Status: DC | PRN
Start: 2022-07-16 — End: 2023-02-05
  Filled 2022-07-16: qty 30, 90d supply, fill #0
  Filled 2022-08-27: qty 30, 90d supply, fill #1
  Filled 2022-12-09: qty 30, 90d supply, fill #2
  Filled 2022-12-09: qty 5, 7d supply, fill #2

## 2022-07-16 NOTE — Assessment & Plan Note (Signed)
Recent thyroid function normal continue thyroid supplement

## 2022-07-16 NOTE — Assessment & Plan Note (Signed)
Patient has been on a chronic every 19-month facial cream this may be the cause  Asked patient to stop this cream and will observe

## 2022-07-16 NOTE — Assessment & Plan Note (Signed)
The patient wanted counseling on contraception be provided for this she does not want to be pregnant during the early stage of her marriage  After exploring all options she agrees to oral birth control Micronor will be prescribed

## 2022-07-16 NOTE — Progress Notes (Signed)
Established Patient Office Visit  Subjective:  Patient ID: Veronica Jensen, female    DOB: 1976-02-07  Age: 47 y.o. MRN: [id number]  CC:  Chief Complaint  Patient presents with   Follow-up    Follow up. Med refill.  Swelling around eyes worsening over last Campus Requesting birth control    HPI 03/2021 [name] [id number], provided Spanish language interpretation services today.  [facility] [name] presents for primary care follow up. She last saw Georgian Co, PA-C in November 2022 for hypothyroidism and Vitamin D deficiency. At that time, her TSH and T4 levels were tested and within normal range on levothyroxine 50 mcg. She was Vitamin D deficient and was prescribed weekly Vitamin D at 50,000 IU.  Today she reports that she is doing well on her Synthroid and Vitamin D. She requests refills for these medications. She is also inquiring if there is a cream available to treat her genital herpes. She takes Valacyclovir for this, but is looking for something that will treat the symptoms of itching and irritation.  Her only complaint today is some throat irritation. She states that her throat feels full, and it can make it difficult to swallow sometimes. She has taken Flonase and cetirizine in the past for seasonal allergies, but is not currently taking any medications for allergies.  She is due for colon cancer screening and plans on obtaining the FOBT kit from the lab. She also is due for tDAP vaccination which she will get today.   07/16/22 Patient seen in follow-up and not been seen since early last year but Dr. Alvis Lemmings did see her more recently.  Visit obtained with Spanish video interpreter [place] [phone].  The patient is planning on becoming married and would like birth control for at least 3 months after marriage.  She would also like a pregnancy test at this visit and vitamin D levels checked.  She does have bilateral periorbital swelling.  This did not change with  Zyrtec given by Dr. Waynetta Sandy at the November visit.  There are no other complaints. Past Medical History:  Diagnosis Date   Abdominal pain    Asthma in adult without complication    Chest pain    Chronic neck pain    Hyperthyroidism    Language barrier    Mood changes    Nausea    Ovarian cyst    Shortness of breath    Varicose veins of both lower extremities     Past Surgical History:  Procedure Laterality Date   MYOMECTOMY      Family History  Problem Relation Age of Onset   Thyroid disease Mother    Hypercholesterolemia Father     Social History   Socioeconomic History   Marital status: Married    Spouse name: Not on file   Number of children: Not on file   Years of education: Not on file   Highest education level: Bachelor's degree (e.g., BA, AB, BS)  Occupational History   Not on file  Tobacco Use   Smoking status: Never    Passive exposure: Never   Smokeless tobacco: Never  Vaping Use   Vaping Use: Never used  Substance and Sexual Activity   Alcohol use: Not Currently   Drug use: Not Currently   Sexual activity: Not Currently    Birth control/protection: None  Other Topics Concern   Not on file  Social History Narrative   Not on file   Social Determinants of Health   Financial Resource  Strain: Not on file  Food Insecurity: No Food Insecurity (03/13/2020)   Hunger Vital Sign    Worried About Running Out of Food in the Last Year: Never true    Ran Out of Food in the Last Year: Never true  Transportation Needs: No Transportation Needs (03/13/2020)   PRAPARE - Administrator, Civil Service (Medical): No    Lack of Transportation (Non-Medical): No  Physical Activity: Not on file  Stress: Not on file  Social Connections: Not on file  Intimate Partner Violence: Not on file    Outpatient Medications Prior to Visit  Medication Sig Dispense Refill   clotrimazole-betamethasone (LOTRISONE) cream Apply 1 application topically 2 (two) times daily.  30 g 0   fluticasone (FLONASE) 50 MCG/ACT nasal spray PLACE 2 SPRAYS INTO BOTH NOSTRILS DAILY. 16 g 0   Vitamin D, Ergocalciferol, (DRISDOL) 1.25 MG (50000 UNIT) CAPS capsule Take 1 capsule (50,000 Units total) by mouth every 7 (seven) days. 12 capsule 3   acyclovir ointment (ZOVIRAX) 5 % Apply 1 Application topically every 6 (six) hours as needed. 30 g 2   SYNTHROID 50 MCG tablet Take 1 tablet (50 mcg total) by mouth daily before breakfast. 30 tablet 0   valACYclovir (VALTREX) 500 MG tablet Take 1 tablet (500 mg total) by mouth 2 (two) times daily for 5 days. 10 tablet 0   cetirizine (ZYRTEC) 10 MG tablet Take 1 tablet (10 mg total) by mouth daily. (Patient not taking: Reported on 07/16/2022) 30 tablet 3   No facility-administered medications prior to visit.    Allergies  Allergen Reactions   Anesthesia S-I-40 [Propofol] Other (See Comments)    Hypotension    ROS Review of Systems  Constitutional:  Negative for fatigue and unexpected weight change.  HENT:  Negative for congestion, sore throat and trouble swallowing (throat feels tight).   Eyes: Negative.        Right orbital edema  Respiratory:  Negative for cough and shortness of breath.   Gastrointestinal:  Negative for diarrhea, nausea and vomiting.  Endocrine: Negative for cold intolerance.  Genitourinary:  Negative for dysuria and genital sores.  Musculoskeletal:  Negative for arthralgias and myalgias.  Skin:  Negative for rash.  Neurological:  Negative for dizziness and headaches.  Psychiatric/Behavioral: Negative.  Negative for dysphoric mood. The patient is not nervous/anxious.       Objective:    Physical Exam Constitutional:      Appearance: Normal appearance. She is normal weight.  HENT:     Head: Normocephalic and atraumatic.     Right Ear: External ear normal.     Left Ear: External ear normal.     Mouth/Throat:     Mouth: Mucous membranes are moist.     Dentition: Normal dentition. No dental caries.      Pharynx: Oropharynx is clear. No posterior oropharyngeal erythema.  Eyes:     General: No scleral icterus.    Extraocular Movements: Extraocular movements intact.     Conjunctiva/sclera: Conjunctivae normal.     Pupils: Pupils are equal, round, and reactive to light.     Comments: Periorbital edema  Neck:     Thyroid: No thyroid mass, thyromegaly or thyroid tenderness.  Cardiovascular:     Rate and Rhythm: Normal rate and regular rhythm.     Pulses: Normal pulses.     Heart sounds: Normal heart sounds. No murmur heard.    No friction rub. No gallop.  Pulmonary:     Effort:  Pulmonary effort is normal.     Breath sounds: Normal breath sounds. No wheezing, rhonchi or rales.  Abdominal:     General: Abdomen is flat.  Musculoskeletal:        General: No swelling. Normal range of motion.     Cervical back: Normal range of motion and neck supple. No tenderness.     Right lower leg: No edema.     Left lower leg: No edema.  Skin:    General: Skin is warm and dry.  Neurological:     General: No focal deficit present.     Mental Status: She is alert and oriented to person, place, and time.  Psychiatric:        Mood and Affect: Mood normal.        Behavior: Behavior normal.        Thought Content: Thought content normal.        Judgment: Judgment normal.     BP 104/68 (BP Location: Left Arm, Patient Position: Sitting, Cuff Size: Normal)   Pulse (!) 53   Temp 98.5 F (36.9 C) (Oral)   Ht 5\' 2"  (1.575 m)   Wt 143 lb (64.9 kg)   SpO2 100%   BMI 26.16 kg/m  Wt Readings from Last 3 Encounters:  07/16/22 143 lb (64.9 kg)  12/31/21 139 lb 9.6 oz (63.3 kg)  06/07/21 131 lb 3.2 oz (59.5 kg)     Health Maintenance Due  Topic Date Due   COLON CANCER SCREENING ANNUAL FOBT  03/28/2022    There are no preventive care reminders to display for this patient.  Lab Results  Component Value Date   TSH 2.610 12/31/2021   Lab Results  Component Value Date   WBC 5.5 12/31/2021   HGB  13.1 12/31/2021   HCT 39.4 12/31/2021   MCV 79 12/31/2021   PLT 289 12/31/2021   Lab Results  Component Value Date   NA 138 12/31/2021   K 4.1 12/31/2021   CO2 22 12/31/2021   GLUCOSE 100 (H) 12/31/2021   BUN 14 12/31/2021   CREATININE 0.88 12/31/2021   BILITOT 0.5 12/31/2021   ALKPHOS 61 12/31/2021   AST 17 12/31/2021   ALT 13 12/31/2021   PROT 6.7 12/31/2021   ALBUMIN 4.5 12/31/2021   CALCIUM 9.3 12/31/2021   ANIONGAP 10 06/08/2018   EGFR 82 12/31/2021   Lab Results  Component Value Date   CHOL 195 12/31/2021   Lab Results  Component Value Date   HDL 68 12/31/2021   Lab Results  Component Value Date   LDLCALC 115 (H) 12/31/2021   Lab Results  Component Value Date   TRIG 66 12/31/2021   Lab Results  Component Value Date   CHOLHDL 2.9 06/01/2019   Lab Results  Component Value Date   HGBA1C 5.5 12/31/2021      Assessment & Plan:   Problem List Items Addressed This Visit       Endocrine   Hypothyroidism    Recent thyroid function normal continue thyroid supplement      Relevant Medications   SYNTHROID 50 MCG tablet     Other   Vitamin D deficiency - Primary    Reassess vitamin D levels      Relevant Orders   VITAMIN D 25 Hydroxy (Vit-D Deficiency, Fractures)   HSV (herpes simplex virus) infection    Continue prevention with oral acyclovir and topical      Relevant Medications   acyclovir ointment (ZOVIRAX) 5 %  valACYclovir (VALTREX) 500 MG tablet   Periorbital edema of both eyes    Patient has been on a chronic every 40-month facial cream this may be the cause  Asked patient to stop this cream and will observe      Encounter for counseling regarding contraception    The patient wanted counseling on contraception be provided for this she does not want to be pregnant during the early stage of her marriage  After exploring all options she agrees to oral birth control Micronor will be prescribed      Other Visit Diagnoses      History of herpes genitalis       Relevant Medications   acyclovir ointment (ZOVIRAX) 5 %   Seasonal allergies       Relevant Medications   cetirizine (ZYRTEC) 10 MG tablet   Encounter for health-related screening       Relevant Orders   hCG, quantitative, pregnancy   hCG, serum, qualitative      Meds ordered this encounter  Medications   acyclovir ointment (ZOVIRAX) 5 %    Sig: Apply 1 Application topically every 6 (six) hours as needed.    Dispense:  30 g    Refill:  2   valACYclovir (VALTREX) 500 MG tablet    Sig: Take 1 tablet (500 mg total) by mouth 2 (two) times daily for 5 days.    Dispense:  10 tablet    Refill:  0   cetirizine (ZYRTEC) 10 MG tablet    Sig: Take 1 tablet (10 mg total) by mouth daily.    Dispense:  30 tablet    Refill:  3   SYNTHROID 50 MCG tablet    Sig: Take 1 tablet (50 mcg total) by mouth daily before breakfast.    Dispense:  30 tablet    Refill:  0    Must keep upcoming office visit for refills   38 minutes spent attaining history and physical navigating language barrier complex decision making multiple systems assessed Follow-up: Return in about 4 months (around 11/16/2022) for chronic conditions.    Shan Levans, MD

## 2022-07-16 NOTE — Assessment & Plan Note (Signed)
Reassess vitamin D levels 

## 2022-07-16 NOTE — Assessment & Plan Note (Signed)
Continue prevention with oral acyclovir and topical

## 2022-07-16 NOTE — Patient Instructions (Addendum)
Lab work for pregnancy test and vit d will be done  We discussed birth control  Stop the cream you have been putting on your face  Return Dr Delford Field 4 months  Se realizarn exmenes de laboratorio para prueba de Psychiatrist y vit d.  Hablamos sobre el control de la natalidad.  Deja la crema que te has estado poniendo en la cara.  Volver Dr Delford Field 4 meses

## 2022-07-17 ENCOUNTER — Other Ambulatory Visit: Payer: Self-pay

## 2022-07-17 LAB — HCG, SERUM, QUALITATIVE: hCG,Beta Subunit,Qual,Serum: NEGATIVE m[IU]/mL (ref <6)

## 2022-07-17 LAB — VITAMIN D 25 HYDROXY (VIT D DEFICIENCY, FRACTURES): Vit D, 25-Hydroxy: 36 ng/mL (ref 30.0–100.0)

## 2022-07-17 NOTE — Progress Notes (Signed)
Let pt know pregnancy test NEGATIVE,  vit D normal she doesn't need high dose vit d she can take otc vit D

## 2022-07-18 ENCOUNTER — Telehealth: Payer: Self-pay

## 2022-07-18 NOTE — Telephone Encounter (Signed)
-----   Message from Storm Frisk, MD sent at 07/17/2022  6:20 AM EDT ----- Let pt know pregnancy test NEGATIVE,  vit D normal she doesn't need high dose vit d she can take otc vit D

## 2022-07-18 NOTE — Telephone Encounter (Signed)
Pt was called and no vm was left due to mailbox not being set up.Information was sent to nurse pool.   Interpreter id #[id number]

## 2022-07-28 ENCOUNTER — Other Ambulatory Visit: Payer: Self-pay

## 2022-07-28 NOTE — Telephone Encounter (Signed)
Patient called for results and notified: Let pt know pregnancy test NEGATIVE,  vit D normal she doesn't need high dose vit d she can take otc vit D

## 2022-08-27 ENCOUNTER — Other Ambulatory Visit: Payer: Self-pay | Admitting: Critical Care Medicine

## 2022-08-27 ENCOUNTER — Telehealth: Payer: Self-pay | Admitting: Critical Care Medicine

## 2022-08-27 ENCOUNTER — Other Ambulatory Visit: Payer: Self-pay

## 2022-08-27 DIAGNOSIS — E039 Hypothyroidism, unspecified: Secondary | ICD-10-CM

## 2022-08-27 MED ORDER — SYNTHROID 50 MCG PO TABS
50.0000 ug | ORAL_TABLET | Freq: Every day | ORAL | 0 refills | Status: DC
Start: 2022-08-27 — End: 2022-11-04
  Filled 2022-08-27 – 2022-08-28 (×4): qty 90, 90d supply, fill #0

## 2022-08-27 MED ORDER — VALACYCLOVIR HCL 500 MG PO TABS
500.0000 mg | ORAL_TABLET | Freq: Two times a day (BID) | ORAL | 0 refills | Status: DC
  Filled 2022-08-27: qty 10, 5d supply, fill #0

## 2022-08-27 NOTE — Telephone Encounter (Signed)
Already refilled in another encounter on 08/27/22

## 2022-08-27 NOTE — Telephone Encounter (Signed)
Medication Refill - Medication: SYNTHROID 50 MCG tablet   Pt called in upset that refills were not sent in, as she was recently seen in the office. Please advise.  Has the patient contacted their pharmacy? Yes.    (Agent: If yes, when and what did the pharmacy advise?)  Preferred Pharmacy (with phone number or street name):  [facility]  [address], [address] [place] [address]  Phone: [phone] Fax: [phone]  Hours: M-F 7:30a-6:00p   Has the patient been seen for an appointment in the last year OR does the patient have an upcoming appointment? Yes.    Agent: Please be advised that RX refills may take up to 3 business days. We ask that you follow-up with your pharmacy.

## 2022-08-28 ENCOUNTER — Other Ambulatory Visit: Payer: Self-pay

## 2022-08-29 ENCOUNTER — Other Ambulatory Visit: Payer: Self-pay

## 2022-10-02 ENCOUNTER — Other Ambulatory Visit: Payer: Self-pay

## 2022-10-28 ENCOUNTER — Other Ambulatory Visit: Payer: Self-pay

## 2022-10-28 ENCOUNTER — Other Ambulatory Visit: Payer: Self-pay | Admitting: Critical Care Medicine

## 2022-10-28 DIAGNOSIS — E039 Hypothyroidism, unspecified: Secondary | ICD-10-CM

## 2022-11-04 ENCOUNTER — Other Ambulatory Visit (HOSPITAL_COMMUNITY): Payer: Self-pay

## 2022-11-04 ENCOUNTER — Other Ambulatory Visit: Payer: Self-pay | Admitting: Critical Care Medicine

## 2022-11-04 DIAGNOSIS — E039 Hypothyroidism, unspecified: Secondary | ICD-10-CM

## 2022-11-04 NOTE — Telephone Encounter (Unsigned)
Copied from CRM [id number]. Topic: General - Other >> Nov 04, 2022  1:13 PM Everette C wrote: Reason for CRM: Medication Refill - Medication: Rx #: [id number]  SYNTHROID 50 MCG tablet [[id number]]   Rx #: [id number]  valACYclovir (VALTREX) 500 MG tablet [[id number]]   Rx #: [id number]  fluticasone (FLONASE) 50 MCG/ACT nasal spray [[id number]]    Has the patient contacted their pharmacy? Yes.   (Agent: If no, request that the patient contact the pharmacy for the refill. If patient does not wish to contact the pharmacy document the reason why and proceed with request.) (Agent: If yes, when and what did the pharmacy advise?)  Preferred Pharmacy (with phone number or street name): [facility] [address], [address] [place] [address] Phone: [phone] Fax: [phone] Hours: M-F 7:30a-6:00p   Has the patient been seen for an appointment in the last year OR does the patient have an upcoming appointment? Yes.    Agent: Please be advised that RX refills may take up to 3 business days. We ask that you follow-up with your pharmacy.

## 2022-11-05 ENCOUNTER — Other Ambulatory Visit: Payer: Self-pay

## 2022-11-05 MED ORDER — VALACYCLOVIR HCL 500 MG PO TABS
500.0000 mg | ORAL_TABLET | Freq: Two times a day (BID) | ORAL | 0 refills | Status: DC
  Filled 2022-11-05: qty 10, 5d supply, fill #0

## 2022-11-05 MED ORDER — FLUTICASONE PROPIONATE 50 MCG/ACT NA SUSP
2.0000 | Freq: Every day | NASAL | 0 refills | Status: DC
  Filled 2022-11-05: qty 16, 30d supply, fill #0

## 2022-11-05 MED ORDER — SYNTHROID 50 MCG PO TABS
50.0000 ug | ORAL_TABLET | Freq: Every day | ORAL | 0 refills | Status: DC
Start: 2022-11-05 — End: 2023-02-05
  Filled 2022-11-05 – 2022-11-25 (×3): qty 90, 90d supply, fill #0

## 2022-11-05 NOTE — Telephone Encounter (Signed)
Requested Prescriptions  Pending Prescriptions Disp Refills   SYNTHROID 50 MCG tablet 90 tablet 0    Sig: Take 1 tablet (50 mcg total) by mouth daily before breakfast.     Endocrinology:  Hypothyroid Agents Passed - 11/04/2022  1:30 PM      Passed - TSH in normal range and within 360 days    TSH  Date Value Ref Range Status  12/31/2021 2.610 0.450 - 4.500 uIU/mL Final         Passed - Valid encounter within last 12 months    Recent Outpatient Visits           3 months ago Hypothyroidism, unspecified type   [facility] Storm Frisk, MD   10 months ago Vitamin D deficiency   [facility] [facility] Hoy Register, MD   1 year ago Vitamin D deficiency   [facility] Storm Frisk, MD   1 year ago Vitamin D deficiency   [facility], Marzella Schlein, [place]   2 years ago Facial swelling   [facility] [place], Shea Stakes, NP       Future Appointments             In 1 month Marcine Matar, MD [place] [facility]             valACYclovir (VALTREX) 500 MG tablet 10 tablet 0    Sig: Take 1 tablet (500 mg total) by mouth 2 (two) times daily for 5 days.     Antimicrobials:  Antiviral Agents - Anti-Herpetic Passed - 11/04/2022  1:30 PM      Passed - Valid encounter within last 12 months    Recent Outpatient Visits           3 months ago Hypothyroidism, unspecified type   [facility] Storm Frisk, MD   10 months ago Vitamin D deficiency   [place] Hoy Register, MD   1 year ago Vitamin D deficiency   [facility] Storm Frisk, MD   1 year ago Vitamin D deficiency   [facility] [place], Marzella Schlein, [place]   2 years ago Facial swelling    [facility] Claiborne Rigg, NP       Future Appointments             In 1 month Marcine Matar, MD [place] [facility]             fluticasone [facility]) 50 MCG/ACT nasal spray 16 g 0    Sig: PLACE 2 SPRAYS INTO BOTH NOSTRILS DAILY.     Ear, Nose, and Throat: Nasal Preparations - Corticosteroids Passed - 11/04/2022  1:30 PM      Passed - Valid encounter within last 12 months    Recent Outpatient Visits           3 months ago Hypothyroidism, unspecified type   [facility] Storm Frisk, MD   10 months ago Vitamin D deficiency   [facility] Hoy Register, MD   1 year ago Vitamin D deficiency    [facility]  Storm Frisk, MD   1 year ago Vitamin D deficiency   [place] [facility] [place], Marzella Schlein, [place]   2 years ago Facial swelling   [facility] [place], Shea Stakes, NP       Future Appointments             In 1 month [place], Binnie Rail, MD [facility]

## 2022-11-18 ENCOUNTER — Ambulatory Visit: Payer: No Typology Code available for payment source | Admitting: Critical Care Medicine

## 2022-11-25 ENCOUNTER — Other Ambulatory Visit: Payer: Self-pay

## 2022-11-26 ENCOUNTER — Other Ambulatory Visit: Payer: Self-pay

## 2022-12-01 ENCOUNTER — Ambulatory Visit: Payer: No Typology Code available for payment source | Admitting: Internal Medicine

## 2022-12-09 ENCOUNTER — Other Ambulatory Visit (HOSPITAL_COMMUNITY): Payer: Self-pay

## 2022-12-09 ENCOUNTER — Other Ambulatory Visit: Payer: Self-pay

## 2022-12-09 ENCOUNTER — Other Ambulatory Visit: Payer: Self-pay | Admitting: Critical Care Medicine

## 2022-12-10 ENCOUNTER — Other Ambulatory Visit: Payer: Self-pay

## 2022-12-11 ENCOUNTER — Other Ambulatory Visit: Payer: Self-pay

## 2022-12-11 ENCOUNTER — Other Ambulatory Visit: Payer: Self-pay | Admitting: Critical Care Medicine

## 2022-12-11 ENCOUNTER — Other Ambulatory Visit (HOSPITAL_COMMUNITY): Payer: Self-pay

## 2022-12-11 MED ORDER — VALACYCLOVIR HCL 500 MG PO TABS
500.0000 mg | ORAL_TABLET | Freq: Two times a day (BID) | ORAL | 0 refills | Status: DC
  Filled 2022-12-11: qty 10, 5d supply, fill #0

## 2022-12-11 NOTE — Telephone Encounter (Signed)
Requested Prescriptions  Pending Prescriptions Disp Refills   valACYclovir (VALTREX) 500 MG tablet 10 tablet 0    Sig: Take 1 tablet (500 mg total) by mouth 2 (two) times daily for 5 days.     Antimicrobials:  Antiviral Agents - Anti-Herpetic Passed - 12/09/2022  2:47 PM      Passed - Valid encounter within last 12 months    Recent Outpatient Visits           4 months ago Hypothyroidism, unspecified type   [facility] Storm Frisk, MD   11 months ago Vitamin D deficiency   [place] [facility] Hoy Register, MD   1 year ago Vitamin D deficiency   [facility] Storm Frisk, MD   1 year ago Vitamin D deficiency   [facility] [place], Marzella Schlein, [place]   2 years ago Facial swelling   [facility] [place], Shea Stakes, NP       Future Appointments             In 3 weeks Marcine Matar, MD [facility]

## 2022-12-12 ENCOUNTER — Other Ambulatory Visit (HOSPITAL_COMMUNITY): Payer: Self-pay

## 2022-12-12 ENCOUNTER — Other Ambulatory Visit: Payer: Self-pay

## 2022-12-12 MED ORDER — VITAMIN D (ERGOCALCIFEROL) 1.25 MG (50000 UNIT) PO CAPS
50000.0000 [IU] | ORAL_CAPSULE | ORAL | 0 refills | Status: AC
  Filled 2022-12-12: qty 4, 28d supply, fill #0

## 2023-01-01 ENCOUNTER — Ambulatory Visit: Payer: No Typology Code available for payment source | Admitting: Internal Medicine

## 2023-01-21 ENCOUNTER — Ambulatory Visit: Payer: No Typology Code available for payment source | Admitting: Physician Assistant

## 2023-02-05 ENCOUNTER — Other Ambulatory Visit: Payer: Self-pay

## 2023-02-05 ENCOUNTER — Encounter: Payer: Self-pay | Admitting: Physician Assistant

## 2023-02-05 ENCOUNTER — Ambulatory Visit: Payer: Self-pay | Attending: Physician Assistant | Admitting: Physician Assistant

## 2023-02-05 VITALS — BP 103/70 | HR 65 | Wt 143.8 lb

## 2023-02-05 DIAGNOSIS — Z603 Acculturation difficulty: Secondary | ICD-10-CM

## 2023-02-05 DIAGNOSIS — E559 Vitamin D deficiency, unspecified: Secondary | ICD-10-CM

## 2023-02-05 DIAGNOSIS — M25551 Pain in right hip: Secondary | ICD-10-CM

## 2023-02-05 DIAGNOSIS — J302 Other seasonal allergic rhinitis: Secondary | ICD-10-CM

## 2023-02-05 DIAGNOSIS — Z758 Other problems related to medical facilities and other health care: Secondary | ICD-10-CM

## 2023-02-05 DIAGNOSIS — E039 Hypothyroidism, unspecified: Secondary | ICD-10-CM

## 2023-02-05 DIAGNOSIS — Z8619 Personal history of other infectious and parasitic diseases: Secondary | ICD-10-CM

## 2023-02-05 MED ORDER — VALACYCLOVIR HCL 500 MG PO TABS
500.0000 mg | ORAL_TABLET | Freq: Two times a day (BID) | ORAL | 1 refills | Status: DC
  Filled 2023-02-05: qty 60, 30d supply, fill #0
  Filled 2023-03-30: qty 60, 30d supply, fill #1

## 2023-02-05 MED ORDER — SYNTHROID 50 MCG PO TABS
50.0000 ug | ORAL_TABLET | Freq: Every day | ORAL | 2 refills | Status: DC
  Filled 2023-02-05 – 2023-02-20 (×2): qty 90, 90d supply, fill #0
  Filled 2023-05-13: qty 90, 90d supply, fill #1
  Filled 2023-07-02 – 2023-08-19 (×2): qty 90, 90d supply, fill #2

## 2023-02-05 MED ORDER — METHOCARBAMOL 500 MG PO TABS
1000.0000 mg | ORAL_TABLET | Freq: Three times a day (TID) | ORAL | 0 refills | Status: AC | PRN
  Filled 2023-02-05: qty 90, 15d supply, fill #0

## 2023-02-05 MED ORDER — FLUTICASONE PROPIONATE 50 MCG/ACT NA SUSP
2.0000 | Freq: Every day | NASAL | 0 refills | Status: DC
  Filled 2023-02-05: qty 16, 30d supply, fill #0

## 2023-02-05 MED ORDER — ACYCLOVIR 5 % EX OINT
1.0000 | TOPICAL_OINTMENT | Freq: Four times a day (QID) | CUTANEOUS | 2 refills | Status: AC | PRN
  Filled 2023-02-05 – 2023-05-13 (×2): qty 30, 8d supply, fill #0
  Filled 2023-07-02: qty 30, 8d supply, fill #1
  Filled 2023-08-19: qty 30, 8d supply, fill #2

## 2023-02-05 NOTE — Progress Notes (Signed)
Patient ID: Veronica Jensen, female   DOB: [birth year], 47 y.o.   MRN: [id number]       Veronica Jensen, is a 47 y.o. female  [id number]  [id number]  DOB - March 29, 1975  Chief Complaint  Patient presents with   Medication Refill       Subjective:   Veronica Jensen is a 47 y.o. female here today for recheck thyroid.  Also needs Rf on meds.  She has been seeing a chiropractor for R hip pain and leg length discrepancy that she says causes the issue.  She also has low back pain at times.  She has not seen an orthopedist.  She says sometimes it gets to the point of pain she is limping.    No problems updated.  ALLERGIES: Allergies  Allergen Reactions   Anesthesia S-I-40 [Propofol] Other (See Comments)    Hypotension    PAST MEDICAL HISTORY: Past Medical History:  Diagnosis Date   Abdominal pain    Asthma in adult without complication    Chest pain    Chronic neck pain    Hyperthyroidism    Language barrier    Mood changes    Nausea    Ovarian cyst    Shortness of breath    Varicose veins of both lower extremities     MEDICATIONS AT HOME: Prior to Admission medications   Medication Sig Start Date End Date Taking? Authorizing Provider  methocarbamol (ROBAXIN) 500 MG tablet Take 2 tablets (1,000 mg total) by mouth every 8 (eight) hours as needed. 02/05/23  Yes Georgian Co M, PA-C  acyclovir ointment (ZOVIRAX) 5 % Apply 1 Application topically every 6 (six) hours as needed. 02/05/23   Anders Simmonds, PA-C  cetirizine (ZYRTEC) 10 MG tablet Take 1 tablet (10 mg total) by mouth daily. 07/16/22   Storm Frisk, MD  clotrimazole-betamethasone (LOTRISONE) cream Apply 1 application topically 2 (two) times daily. 05/22/21   Storm Frisk, MD  fluticasone (FLONASE) 50 MCG/ACT nasal spray PLACE 2 SPRAYS INTO BOTH NOSTRILS DAILY. 02/05/23   Anders Simmonds, PA-C  norethindrone (ORTHO MICRONOR) 0.35 MG tablet Take 1 tablet (0.35 mg  total) by mouth daily. 07/16/22   Storm Frisk, MD  SYNTHROID 50 MCG tablet Take 1 tablet (50 mcg total) by mouth daily before breakfast. 02/05/23   Anders Simmonds, PA-C  valACYclovir (VALTREX) 500 MG tablet Take 1 tablet (500 mg total) by mouth 2 (two) times daily. prn 02/05/23   Anders Simmonds, PA-C  Vitamin D, Ergocalciferol, (DRISDOL) 1.25 MG (50000 UNIT) CAPS capsule Take 1 capsule (50,000 Units total) by mouth every 7 (seven) days. 12/12/22   Storm Frisk, MD  nitroGLYCERIN (NITROSTAT) 0.4 MG SL tablet Place 1 tablet (0.4 mg total) under the tongue every 5 (five) minutes as needed for chest pain. Patient not taking: No sig reported 06/08/18 05/23/19  Kallie Locks, FNP    ROS: Neg HEENT Neg resp Neg cardiac Neg GI Neg GU Neg MS Neg psych Neg neuro  Objective:   Vitals:   02/05/23 1432  BP: 103/70  Pulse: 65  SpO2: 100%  Weight: 143 lb 12.8 oz (65.2 kg)   Exam General appearance : Awake, alert, not in any distress. Speech Clear. Not toxic looking HEENT: Atraumatic and Normocephalic Neck: Supple, no JVD. No cervical lymphadenopathy.  Chest: Good air entry bilaterally, CTAB.  No rales/rhonchi/wheezing CVS: S1 S2 regular, no murmurs.  Extremities: B/L Lower Ext shows no edema, both legs  are warm to touch Neurology: Awake alert, and oriented X 3, CN II-XII intact, Non focal Skin: No Rash  Data Review Lab Results  Component Value Date   HGBA1C 5.5 12/31/2021   HGBA1C 5.4 06/01/2019   HGBA1C 5.5 06/08/2018    Assessment & Plan   1. Hypothyroidism, unspecified type - Thyroid Panel With TSH - SYNTHROID 50 MCG tablet; Take 1 tablet (50 mcg total) by mouth daily before breakfast.  Dispense: 90 tablet; Refill: 2  2. Vitamin D deficiency (Primary) Has been off prescription about 3 months - Vitamin D, 25-hydroxy  3. Language barrier Veronica Jensen with interpreters used and additional time performing visit was required.  4. Right hip pain - methocarbamol  (ROBAXIN) 500 MG tablet; Take 2 tablets (1,000 mg total) by mouth every 8 (eight) hours as needed.  Dispense: 90 tablet; Refill: 0 - Ambulatory referral to Orthopedic Surgery  5. History of herpes genitalis - valACYclovir (VALTREX) 500 MG tablet; Take 1 tablet (500 mg total) by mouth 2 (two) times daily. prn  Dispense: 60 tablet; Refill: 1 - acyclovir ointment (ZOVIRAX) 5 %; Apply 1 Application topically every 6 (six) hours as needed.  Dispense: 30 g; Refill: 2  6. Seasonal allergies - fluticasone (FLONASE) 50 MCG/ACT nasal spray; PLACE 2 SPRAYS INTO BOTH NOSTRILS DAILY.  Dispense: 16 g; Refill: 0    Return in about 6 months (around 08/06/2023) for please assign to a new PCP.  The patient was given clear instructions to go to ER or return to medical center if symptoms don't improve, worsen or new problems develop. The patient verbalized understanding. The patient was told to call to get lab results if they haven't heard anything in the next week.      Georgian Co, PA-C [facility] [place], [place] [phone]   02/05/2023, 3:38 PM

## 2023-02-06 ENCOUNTER — Telehealth: Payer: Self-pay

## 2023-02-06 ENCOUNTER — Other Ambulatory Visit: Payer: Self-pay | Admitting: Critical Care Medicine

## 2023-02-06 LAB — VITAMIN D 25 HYDROXY (VIT D DEFICIENCY, FRACTURES): Vit D, 25-Hydroxy: 37.3 ng/mL (ref 30.0–100.0)

## 2023-02-06 LAB — THYROID PANEL WITH TSH
Free Thyroxine Index: 2 (ref 1.2–4.9)
T3 Uptake Ratio: 24 % (ref 24–39)
T4, Total: 8.3 ug/dL (ref 4.5–12.0)
TSH: 1.86 u[IU]/mL (ref 0.450–4.500)

## 2023-02-06 NOTE — Telephone Encounter (Signed)
-----   Message from Georgian Co sent at 02/06/2023 12:58 PM EST ----- Continue current dose of levothyroxine.  Vitamin D is normal so you can take over the counter vitamin D 1000 units daily to help sustain that.  Thanks, Georgian Co, PA-C

## 2023-02-06 NOTE — Telephone Encounter (Signed)
Pt was called and is aware of results, DOB was confirmed.    Interpreter id # [id number]

## 2023-02-20 ENCOUNTER — Other Ambulatory Visit: Payer: Self-pay

## 2023-02-20 ENCOUNTER — Other Ambulatory Visit: Payer: Self-pay | Admitting: Critical Care Medicine

## 2023-02-23 ENCOUNTER — Other Ambulatory Visit: Payer: Self-pay

## 2023-02-24 ENCOUNTER — Encounter: Payer: Self-pay | Admitting: Physician Assistant

## 2023-02-24 ENCOUNTER — Other Ambulatory Visit (INDEPENDENT_AMBULATORY_CARE_PROVIDER_SITE_OTHER): Payer: No Typology Code available for payment source

## 2023-02-24 ENCOUNTER — Other Ambulatory Visit: Payer: Self-pay

## 2023-02-24 ENCOUNTER — Ambulatory Visit: Payer: No Typology Code available for payment source | Admitting: Physician Assistant

## 2023-02-24 DIAGNOSIS — M25551 Pain in right hip: Secondary | ICD-10-CM

## 2023-02-24 MED ORDER — DICLOFENAC SODIUM 75 MG PO TBEC
75.0000 mg | DELAYED_RELEASE_TABLET | Freq: Two times a day (BID) | ORAL | 2 refills | Status: DC | PRN
  Filled 2023-02-24 – 2023-07-02 (×2): qty 60, 30d supply, fill #0

## 2023-02-24 NOTE — Progress Notes (Addendum)
 Office Visit Note   Patient: Veronica Jensen           Date of Birth: 1975/03/19           MRN: [id number] Visit Date: 02/24/2023              Requested by: Danton Jon HERO, PA-C [address] [place],  [place] [address] PCP: Brien Belvie BRAVO, MD   Assessment & Plan: Visit Diagnoses:  1. Pain in right hip     Plan: Impression is right lower back and buttock pain.  Symptoms seem consistent with lumbar pathology.  I would like to start her on a NSAID and send her to physical therapy.  Referral has been made.  Follow-up as needed.  Follow-Up Instructions: Return if symptoms worsen or fail to improve.   Orders:  Orders Placed This Encounter  Procedures   XR Lumbar Spine 2-3 Views   XR HIP UNILAT W OR W/O PELVIS 2-3 VIEWS RIGHT   Ambulatory referral to Physical Therapy   Meds ordered this encounter  Medications   diclofenac  (VOLTAREN ) 75 MG EC tablet    Sig: Take 1 tablet (75 mg total) by mouth 2 (two) times daily as needed.    Dispense:  60 tablet    Refill:  2      Procedures: No procedures performed   Clinical Data: No additional findings.   Subjective: Chief Complaint  Patient presents with   Right Hip - Pain    HPI patient is a pleasant 48 year old Spanish-speaking female who is here today with an interpreter.  She is here with right low back and buttock pain.  For the past 6 months.  Symptoms have been intermittent.  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  She denies any pain radiating down her leg or into the groin.  No weakness.  She notes occasional paresthesias in her legs if she sits in 1 place for a long time.  The pain she has is worse when she is working.  She has not tried any medication for this.  Review of Systems as detailed in HPI.  All others reviewed and are negative.   Objective: Vital Signs: There were no vitals taken for this  visit.  Physical Exam well-developed well-nourished female no acute distress.  Alert and oriented x 3.  Ortho Exam right hip exam: No pain with logroll, FADIR or Stinchfield testing.  Negative straight leg raise.  She has tenderness to the right lower lumbar paraspinous musculature.  No tenderness over the piriformis.  No pain with lumbar flexion or extension.  No focal weakness.  She is neurovascular intact distally.  Specialty Comments:  No specialty comments available.  Imaging: No results found.    PMFS History: Patient Active Problem List   Diagnosis Date Noted   Periorbital edema of both eyes 07/16/2022   Encounter for counseling regarding contraception 07/16/2022   Language barrier affecting health care 03/28/2021   Seasonal allergic rhinitis 03/28/2021   HSV (herpes simplex virus) infection 03/13/2020   Cervical cancer screening 01/18/2020   Vitamin D  deficiency 01/18/2020   Mild asthma without complication 06/02/2019   History of ovarian cyst 07/15/2018   Varicose veins of bilateral lower extremities with pain 07/15/2018   Hypothyroidism 06/08/2018   Past Medical History:  Diagnosis Date   Abdominal pain    Asthma in adult without complication  Chest pain    Chronic neck pain    Hyperthyroidism    Language barrier    Mood changes    Nausea    Ovarian cyst    Shortness of breath    Varicose veins of both lower extremities     Family History  Problem Relation Age of Onset   Thyroid  disease Mother    Hypercholesterolemia Father     Past Surgical History:  Procedure Laterality Date   MYOMECTOMY     Social History   Occupational History   Not on file  Tobacco Use   Smoking status: Never    Passive exposure: Never   Smokeless tobacco: Never  Vaping Use   Vaping status: Never Used  Substance and Sexual Activity   Alcohol use: Not Currently   Drug use: Not Currently   Sexual activity: Not Currently    Birth control/protection: None

## 2023-02-25 ENCOUNTER — Telehealth: Payer: Self-pay | Admitting: Physician Assistant

## 2023-02-25 NOTE — Telephone Encounter (Signed)
 We discussed meds and PT.  She did not have any reg flag symptoms yesterday to warrant mri.  Insurance will not cover MRI until she has tried PT for at least 6 weeks.

## 2023-02-25 NOTE — Telephone Encounter (Signed)
 Patient called and said she wants another test on her back or another option about herf situation.  CB#[phone]

## 2023-02-25 NOTE — Telephone Encounter (Signed)
 Spoke with patient. I relayed information to patient. She states that she has already spoke with front desk and scheduled to see Dr.Xu. She wants other options because her mom has osteoporosis.

## 2023-03-10 ENCOUNTER — Ambulatory Visit: Payer: No Typology Code available for payment source | Admitting: Orthopaedic Surgery

## 2023-03-10 DIAGNOSIS — M545 Low back pain, unspecified: Secondary | ICD-10-CM | POA: Diagnosis not present

## 2023-03-10 DIAGNOSIS — G8929 Other chronic pain: Secondary | ICD-10-CM

## 2023-03-10 NOTE — Progress Notes (Signed)
Office Visit Note   Patient: Veronica Jensen           Date of Birth: June 02, 1975           MRN: [id number] Visit Date: 03/10/2023              Requested by: Storm Frisk, MD [address] [place],  [place] [address] PCP: Storm Frisk, MD   Assessment & Plan: Visit Diagnoses:  1. 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  No radicular symptoms.  No hip symptoms.  Patient is concerned about her physical therapy not being covered by her insurance plan therefore I will give her spine conditioning exercises.  I will also make referral to [facility] for osteopenia management.  Interpreter present which increased complexity of visit.  Follow-Up Instructions: No follow-ups on file.   Orders:  Orders Placed This Encounter  Procedures   Amb Referral to Osteoporosis Management    No orders of the defined types were placed in this encounter.     Procedures: No procedures performed   Clinical Data: No additional findings.   Subjective: Chief Complaint  Patient presents with   Right Leg - Pain   Right Hip - Pain    HPI Patient comes in today for discussion of further options of her back.  Recently saw [facility]. Review of Systems  Constitutional: Negative.   HENT: Negative.    Eyes: Negative.   Respiratory: Negative.    Cardiovascular: Negative.   Endocrine: Negative.   Musculoskeletal: Negative.   Neurological: Negative.   Hematological: Negative.   Psychiatric/Behavioral: Negative.    All other systems reviewed and are negative.    Objective: Vital Signs: There were no vitals taken for this visit.  Physical Exam Vitals and nursing note reviewed.  Constitutional:      Appearance: She is well-developed.  HENT:     Head: Atraumatic.     Nose: Nose normal.  Eyes:     Extraocular Movements: Extraocular movements intact.  Cardiovascular:      Pulses: Normal pulses.  Pulmonary:     Effort: Pulmonary effort is normal.  Abdominal:     Palpations: Abdomen is soft.  Musculoskeletal:     Cervical back: Neck supple.  Skin:    General: Skin is warm.     Capillary Refill: Capillary refill takes less than 2 seconds.  Neurological:     Mental Status: She is alert. Mental status is at baseline.  Psychiatric:        Behavior: Behavior normal.        Thought Content: Thought content normal.        Judgment: Judgment normal.     Ortho Exam Examination of the right hip is normal.  She has pain to the right lower lumbar spine. Specialty Comments:  No specialty comments available.  Imaging: No results found.   PMFS History: Patient Active Problem List   Diagnosis Date Noted   Periorbital edema of both eyes 07/16/2022   Encounter for counseling regarding contraception 07/16/2022   Language barrier affecting health care 03/28/2021   Seasonal allergic rhinitis 03/28/2021   HSV (herpes simplex virus) infection 03/13/2020   Cervical cancer screening 01/18/2020   Vitamin D deficiency 01/18/2020   Mild asthma without complication 06/02/2019   History of ovarian cyst 07/15/2018   Varicose veins of bilateral lower extremities with pain 07/15/2018  Hypothyroidism 06/08/2018   Past Medical History:  Diagnosis Date   Abdominal pain    Asthma in adult without complication    Chest pain    Chronic neck pain    Hyperthyroidism    Language barrier    Mood changes    Nausea    Ovarian cyst    Shortness of breath    Varicose veins of both lower extremities     Family History  Problem Relation Age of Onset   Thyroid disease Mother    Hypercholesterolemia Father     Past Surgical History:  Procedure Laterality Date   MYOMECTOMY     Social History   Occupational History   Not on file  Tobacco Use   Smoking status: Never    Passive exposure: Never   Smokeless tobacco: Never  Vaping Use   Vaping status: Never Used   Substance and Sexual Activity   Alcohol use: Not Currently   Drug use: Not Currently   Sexual activity: Not Currently    Birth control/protection: None

## 2023-03-13 ENCOUNTER — Other Ambulatory Visit (HOSPITAL_COMMUNITY): Payer: Self-pay

## 2023-03-16 ENCOUNTER — Ambulatory Visit: Payer: No Typology Code available for payment source | Admitting: Physician Assistant

## 2023-03-16 VITALS — Ht 63.0 in | Wt 142.4 lb

## 2023-03-16 DIAGNOSIS — M810 Age-related osteoporosis without current pathological fracture: Secondary | ICD-10-CM | POA: Insufficient documentation

## 2023-03-16 NOTE — Progress Notes (Signed)
Office Visit Note   Patient: Veronica Jensen           Date of Birth: [birth year]           MRN: [id number] Visit Date: 03/16/2023              Requested by: Storm Frisk, MD [address] [place],  [place] [address] PCP: Storm Frisk, MD   Assessment & Plan: Visit Diagnoses:  1. Age-related osteoporosis without current pathological fracture     Plan: Patient is a pleasant 48 year old woman who was referred by Dr. Roda Shutters for possible osteoporosis management.  She is seen with the accompaniment of an interpreter today.  She has never had any type of fracture.  She has never been on any osteoporosis medication.  No history of cancer kidney disease, peptic ulcer no history of reflux or bypass surgery.  No history of epilepsy or seizures.  She still gets her periods though not regularly.  She was prescribed vitamin D but does not take it now.  She is not a smoker she does not exercise or drink.  She has had no recent major dental work.  She does say she has relatives with a history of osteoporosis but none have had any fragility fractures that she knows of.  She is concerned because she said about 15 years ago she had a bone test done in her country and it showed she had osteopenia.  I had a long discussion with her through the interpreter.  We discussed doing some weight training exercises.  She does need to be on a maintenance level of vitamin D and make sure she has adequate calcium in her diet.  As she is perimenopausal and does not have any high risk factors based on her information today I think she might be better going through her gynecologist to talk about estrogen replacement therapy.  Probably a bone density in a couple years.  She asked if she can get 1 done in another country and bring it to [place] I told her as long as there was a [place] interpretation that would be fine.  She said she wants to be on osteoporosis medication just like her mom.  I told her that based  on current presentation this is what I would recommend  Follow-Up Instructions: No follow-ups on file.   Orders:  No orders of the defined types were placed in this encounter.  No orders of the defined types were placed in this encounter.     Procedures: No procedures performed   Clinical Data: No additional findings.   Subjective: Chief Complaint  Patient presents with   Lower Back - Pain    HPI pleasant 48 year old woman comes in today for evaluation of osteoporosis medication.  She is seen by Dr. Roda Shutters with a history of low back pain but no fracture.  She did not want to do physical therapy but has been trying to do exercises on her own.  Review of Systems  All other systems reviewed and are negative.    Objective: Vital Signs: Ht 5\' 3"  (1.6 m)   Wt 142 lb 6.4 oz (64.6 kg)   BMI 25.23 kg/m   Physical Exam Constitutional:      Appearance: Normal appearance.  Pulmonary:     Effort: Pulmonary effort is normal.  Skin:    General: Skin is warm and dry.  Neurological:     General: No focal deficit present.  Mental Status: She is alert and oriented to person, place, and time.  Psychiatric:        Mood and Affect: Mood normal.        Behavior: Behavior normal.       Specialty Comments:  No specialty comments available.  Imaging: No results found.   PMFS History: Patient Active Problem List   Diagnosis Date Noted   Age-related osteoporosis without current pathological fracture 03/16/2023   Periorbital edema of both eyes 07/16/2022   Encounter for counseling regarding contraception 07/16/2022   Language barrier affecting health care 03/28/2021   Seasonal allergic rhinitis 03/28/2021   HSV (herpes simplex virus) infection 03/13/2020   Cervical cancer screening 01/18/2020   Vitamin D deficiency 01/18/2020   Mild asthma without complication 06/02/2019   History of ovarian cyst 07/15/2018   Varicose veins of bilateral lower extremities with pain  07/15/2018   Hypothyroidism 06/08/2018   Past Medical History:  Diagnosis Date   Abdominal pain    Asthma in adult without complication    Chest pain    Chronic neck pain    Hyperthyroidism    Language barrier    Mood changes    Nausea    Ovarian cyst    Shortness of breath    Varicose veins of both lower extremities     Family History  Problem Relation Age of Onset   Thyroid disease Mother    Hypercholesterolemia Father     Past Surgical History:  Procedure Laterality Date   MYOMECTOMY     Social History   Occupational History   Not on file  Tobacco Use   Smoking status: Never    Passive exposure: Never   Smokeless tobacco: Never  Vaping Use   Vaping status: Never Used  Substance and Sexual Activity   Alcohol use: Not Currently   Drug use: Not Currently   Sexual activity: Not Currently    Birth control/protection: None

## 2023-03-30 ENCOUNTER — Other Ambulatory Visit: Payer: Self-pay | Admitting: Physician Assistant

## 2023-03-30 ENCOUNTER — Other Ambulatory Visit (HOSPITAL_COMMUNITY): Payer: Self-pay

## 2023-03-30 DIAGNOSIS — J302 Other seasonal allergic rhinitis: Secondary | ICD-10-CM

## 2023-03-30 MED ORDER — FLUTICASONE PROPIONATE 50 MCG/ACT NA SUSP
2.0000 | Freq: Every day | NASAL | 0 refills | Status: DC
  Filled 2023-03-30: qty 16, 30d supply, fill #0

## 2023-03-31 ENCOUNTER — Other Ambulatory Visit: Payer: Self-pay

## 2023-04-08 NOTE — Progress Notes (Deleted)
 Established Patient Office Visit  Subjective:  Patient ID: Veronica Jensen, female    DOB: December 04, 1975  Age: 48 y.o. MRN: [id number]  CC:  No chief complaint on file.   HPI 03/2021 [name] [id number], provided Spanish language interpretation services today.  [name] presents for primary care follow up. She last saw Georgian Co, PA-C in November 2022 for hypothyroidism and Vitamin D deficiency. At that time, her TSH and T4 levels were tested and within normal range on levothyroxine 50 mcg. She was Vitamin D deficient and was prescribed weekly Vitamin D at 50,000 IU.  Today she reports that she is doing well on her Synthroid and Vitamin D. She requests refills for these medications. She is also inquiring if there is a cream available to treat her genital herpes. She takes Valacyclovir for this, but is looking for something that will treat the symptoms of itching and irritation.  Her only complaint today is some throat irritation. She states that her throat feels full, and it can make it difficult to swallow sometimes. She has taken Flonase and cetirizine in the past for seasonal allergies, but is not currently taking any medications for allergies.  She is due for colon cancer screening and plans on obtaining the FOBT kit from the lab. She also is due for tDAP vaccination which she will get today.   07/16/22 Patient seen in follow-up and not been seen since early last year but Dr. Alvis Lemmings did see her more recently.  Visit obtained with Spanish video interpreter Gilbert [phone].  The patient is planning on becoming married and would like birth control for at least 3 months after marriage.  She would also like a pregnancy test at this visit and vitamin D levels checked.  She does have bilateral periorbital swelling.  This did not change with Zyrtec given by Dr. Waynetta Sandy at the November visit.  There are no other complaints.  04/16/23 Last seen 01/2023 mcclung .  Hypothyroidism, unspecified type - Thyroid Panel With TSH - SYNTHROID 50 MCG tablet; Take 1 tablet (50 mcg total) by mouth daily before breakfast.  Dispense: 90 tablet; Refill: 2   2. Vitamin D deficiency (Primary) Has been off prescription about 3 months - Vitamin D, 25-hydroxy   3. Language barrier Marlon with interpreters used and additional time performing visit was required.   4. Right hip pain - methocarbamol (ROBAXIN) 500 MG tablet; Take 2 tablets (1,000 mg total) by mouth every 8 (eight) hours as needed.  Dispense: 90 tablet; Refill: 0 - Ambulatory referral to Orthopedic Surgery   5. History of herpes genitalis - valACYclovir (VALTREX) 500 MG tablet; Take 1 tablet (500 mg total) by mouth 2 (two) times daily. prn  Dispense: 60 tablet; Refill: 1 - acyclovir ointment (ZOVIRAX) 5 %; Apply 1 Application topically every 6 (six) hours as needed.  Dispense: 30 g; Refill: 2   6. Seasonal allergies - fluticasone (FLONASE) 50 MCG/ACT nasal spray; PLACE 2 SPRAYS INTO BOTH NOSTRILS DAILY.  Dispense: 16 g; Refill: 0       Past Medical History:  Diagnosis Date   Abdominal pain    Asthma in adult without complication    Chest pain    Chronic neck pain    Hyperthyroidism    Language barrier    Mood changes    Nausea    Ovarian cyst    Shortness of breath    Varicose veins of both lower extremities     Past Surgical History:  Procedure  Laterality Date   MYOMECTOMY      Family History  Problem Relation Age of Onset   Thyroid disease Mother    Hypercholesterolemia Father     Social History   Socioeconomic History   Marital status: Married    Spouse name: Not on file   Number of children: Not on file   Years of education: Not on file   Highest education level: Bachelor's degree (e.g., BA, AB, BS)  Occupational History   Not on file  Tobacco Use   Smoking status: Never    Passive exposure: Never   Smokeless tobacco: Never  Vaping Use   Vaping status: Never Used   Substance and Sexual Activity   Alcohol use: Not Currently   Drug use: Not Currently   Sexual activity: Not Currently    Birth control/protection: None  Other Topics Concern   Not on file  Social History Narrative   Not on file   Social Drivers of Health   Financial Resource Strain: Not on file  Food Insecurity: No Food Insecurity (03/13/2020)   Hunger Vital Sign    Worried About Running Out of Food in the Last Year: Never true    Ran Out of Food in the Last Year: Never true  Transportation Needs: No Transportation Needs (03/13/2020)   PRAPARE - Administrator, Civil Service (Medical): No    Lack of Transportation (Non-Medical): No  Physical Activity: Not on file  Stress: Not on file  Social Connections: Not on file  Intimate Partner Violence: Not on file    Outpatient Medications Prior to Visit  Medication Sig Dispense Refill   acyclovir ointment (ZOVIRAX) 5 % Apply 1 Application topically every 6 (six) hours as needed. 30 g 2   diclofenac (VOLTAREN) 75 MG EC tablet Take 1 tablet (75 mg total) by mouth 2 (two) times daily as needed. 60 tablet 2   fluticasone (FLONASE) 50 MCG/ACT nasal spray Place 2 sprays into both nostrils daily. 16 g 0   methocarbamol (ROBAXIN) 500 MG tablet Take 2 tablets (1,000 mg total) by mouth every 8 (eight) hours as needed. 90 tablet 0   SYNTHROID 50 MCG tablet Take 1 tablet (50 mcg total) by mouth daily before breakfast. 90 tablet 2   valACYclovir (VALTREX) 500 MG tablet Take 1 tablet (500 mg total) by mouth 2 (two) times daily. prn 60 tablet 1   Vitamin D, Ergocalciferol, (DRISDOL) 1.25 MG (50000 UNIT) CAPS capsule Take 1 capsule (50,000 Units total) by mouth every 7 (seven) days. 4 capsule 0   No facility-administered medications prior to visit.    Allergies  Allergen Reactions   Anesthesia S-I-40 [Propofol] Other (See Comments)    Hypotension    ROS Review of Systems  Constitutional:  Negative for fatigue and unexpected weight  change.  HENT:  Negative for congestion, sore throat and trouble swallowing (throat feels tight).   Eyes: Negative.        Right orbital edema  Respiratory:  Negative for cough and shortness of breath.   Gastrointestinal:  Negative for diarrhea, nausea and vomiting.  Endocrine: Negative for cold intolerance.  Genitourinary:  Negative for dysuria and genital sores.  Musculoskeletal:  Negative for arthralgias and myalgias.  Skin:  Negative for rash.  Neurological:  Negative for dizziness and headaches.  Psychiatric/Behavioral: Negative.  Negative for dysphoric mood. The patient is not nervous/anxious.       Objective:    Physical Exam Constitutional:      Appearance:  Normal appearance. She is normal weight.  HENT:     Head: Normocephalic and atraumatic.     Right Ear: External ear normal.     Left Ear: External ear normal.     Mouth/Throat:     Mouth: Mucous membranes are moist.     Dentition: Normal dentition. No dental caries.     Pharynx: Oropharynx is clear. No posterior oropharyngeal erythema.  Eyes:     General: No scleral icterus.    Extraocular Movements: Extraocular movements intact.     Conjunctiva/sclera: Conjunctivae normal.     Pupils: Pupils are equal, round, and reactive to light.     Comments: Periorbital edema  Neck:     Thyroid: No thyroid mass, thyromegaly or thyroid tenderness.  Cardiovascular:     Rate and Rhythm: Normal rate and regular rhythm.     Pulses: Normal pulses.     Heart sounds: Normal heart sounds. No murmur heard.    No friction rub. No gallop.  Pulmonary:     Effort: Pulmonary effort is normal.     Breath sounds: Normal breath sounds. No wheezing, rhonchi or rales.  Abdominal:     General: Abdomen is flat.  Musculoskeletal:        General: No swelling. Normal range of motion.     Cervical back: Normal range of motion and neck supple. No tenderness.     Right lower leg: No edema.     Left lower leg: No edema.  Skin:    General: Skin  is warm and dry.  Neurological:     General: No focal deficit present.     Mental Status: She is alert and oriented to person, place, and time.  Psychiatric:        Mood and Affect: Mood normal.        Behavior: Behavior normal.        Thought Content: Thought content normal.        Judgment: Judgment normal.     There were no vitals taken for this visit. Wt Readings from Last 3 Encounters:  03/16/23 142 lb 6.4 oz (64.6 kg)  02/05/23 143 lb 12.8 oz (65.2 kg)  07/16/22 143 lb (64.9 kg)     Health Maintenance Due  Topic Date Due   Pneumococcal Vaccine 24-101 Years old (1 of 2 - PCV) Never done   Cervical Cancer Screening (HPV/Pap Cotest)  Never done   COLON CANCER SCREENING ANNUAL FOBT  03/28/2022   INFLUENZA VACCINE  Never done    There are no preventive care reminders to display for this patient.  Lab Results  Component Value Date   TSH 1.860 02/05/2023   Lab Results  Component Value Date   WBC 5.5 12/31/2021   HGB 13.1 12/31/2021   HCT 39.4 12/31/2021   MCV 79 12/31/2021   PLT 289 12/31/2021   Lab Results  Component Value Date   NA 138 12/31/2021   K 4.1 12/31/2021   CO2 22 12/31/2021   GLUCOSE 100 (H) 12/31/2021   BUN 14 12/31/2021   CREATININE 0.88 12/31/2021   BILITOT 0.5 12/31/2021   ALKPHOS 61 12/31/2021   AST 17 12/31/2021   ALT 13 12/31/2021   PROT 6.7 12/31/2021   ALBUMIN 4.5 12/31/2021   CALCIUM 9.3 12/31/2021   ANIONGAP 10 06/08/2018   EGFR 82 12/31/2021   Lab Results  Component Value Date   CHOL 195 12/31/2021   Lab Results  Component Value Date   HDL 68 12/31/2021  Lab Results  Component Value Date   LDLCALC 115 (H) 12/31/2021   Lab Results  Component Value Date   TRIG 66 12/31/2021   Lab Results  Component Value Date   CHOLHDL 2.9 06/01/2019   Lab Results  Component Value Date   HGBA1C 5.5 12/31/2021      Assessment & Plan:   Problem List Items Addressed This Visit   None   No orders of the defined types were  placed in this encounter.  38 minutes spent attaining history and physical navigating language barrier complex decision making multiple systems assessed Follow-up: No follow-ups on file.    Shan Levans, MD

## 2023-04-16 ENCOUNTER — Ambulatory Visit: Payer: No Typology Code available for payment source | Admitting: Critical Care Medicine

## 2023-04-28 ENCOUNTER — Other Ambulatory Visit (HOSPITAL_COMMUNITY): Payer: Self-pay

## 2023-05-13 ENCOUNTER — Other Ambulatory Visit: Payer: Self-pay | Admitting: Family Medicine

## 2023-05-13 ENCOUNTER — Other Ambulatory Visit: Payer: Self-pay | Admitting: Physician Assistant

## 2023-05-13 ENCOUNTER — Other Ambulatory Visit (HOSPITAL_COMMUNITY): Payer: Self-pay

## 2023-05-13 ENCOUNTER — Other Ambulatory Visit: Payer: Self-pay

## 2023-05-13 DIAGNOSIS — Z8619 Personal history of other infectious and parasitic diseases: Secondary | ICD-10-CM

## 2023-05-13 DIAGNOSIS — J302 Other seasonal allergic rhinitis: Secondary | ICD-10-CM

## 2023-05-13 MED ORDER — FLUTICASONE PROPIONATE 50 MCG/ACT NA SUSP
2.0000 | Freq: Every day | NASAL | 0 refills | Status: DC
  Filled 2023-05-13: qty 16, 30d supply, fill #0

## 2023-05-13 MED ORDER — VALACYCLOVIR HCL 500 MG PO TABS
500.0000 mg | ORAL_TABLET | Freq: Two times a day (BID) | ORAL | 1 refills | Status: DC
  Filled 2023-05-13: qty 60, 30d supply, fill #0
  Filled 2023-07-02: qty 60, 30d supply, fill #1

## 2023-05-14 ENCOUNTER — Other Ambulatory Visit: Payer: Self-pay

## 2023-05-14 ENCOUNTER — Other Ambulatory Visit (HOSPITAL_COMMUNITY): Payer: Self-pay

## 2023-07-02 ENCOUNTER — Other Ambulatory Visit: Payer: Self-pay

## 2023-07-02 ENCOUNTER — Other Ambulatory Visit (HOSPITAL_COMMUNITY): Payer: Self-pay

## 2023-08-06 ENCOUNTER — Telehealth: Payer: Self-pay | Admitting: Critical Care Medicine

## 2023-08-06 NOTE — Telephone Encounter (Signed)
 Contacted pt no vm to leave a message

## 2023-08-07 ENCOUNTER — Telehealth: Payer: Self-pay | Admitting: Critical Care Medicine

## 2023-08-07 NOTE — Telephone Encounter (Signed)
 2nd attempt: contacted pt phone going straight vm... left a message to confirm appt .Veronica Jensen

## 2023-08-10 ENCOUNTER — Ambulatory Visit: Payer: Self-pay | Admitting: Family Medicine

## 2023-08-13 ENCOUNTER — Other Ambulatory Visit: Payer: Self-pay | Admitting: Critical Care Medicine

## 2023-08-13 DIAGNOSIS — E039 Hypothyroidism, unspecified: Secondary | ICD-10-CM

## 2023-08-13 NOTE — Telephone Encounter (Unsigned)
 Copied from CRM [id number]. Topic: Clinical - Medication Refill >> Aug 13, 2023  2:05 PM Rachelle R wrote: Medication: SYNTHROID  50 MCG tablet   Has the patient contacted their pharmacy? Yes, call dr  This is the patient's preferred pharmacy:  [facility] [address], [address] [place] [address] Phone: [phone] Fax: [phone]  Is this the correct pharmacy for this prescription? Yes If no, delete pharmacy and type the correct one.   Has the prescription been filled recently? No  Is the patient out of the medication? No, 2 pills  Has the patient been seen for an appointment in the last year OR does the patient have an upcoming appointment? Yes  Can we respond through MyChart? Yes  Agent: Please be advised that Rx refills may take up to 3 business days. We ask that you follow-up with your pharmacy.

## 2023-08-14 NOTE — Telephone Encounter (Signed)
 Requested medications are due for refill today.  no  Requested medications are on the active medications list.  yes  Last refill. 02/05/2023 #90 2 rf  Future visit scheduled.   no  Notes to clinic.  Refill not due, Dr. Brien listed as PCP.    Requested Prescriptions  Pending Prescriptions Disp Refills   SYNTHROID  50 MCG tablet 90 tablet 2    Sig: Take 1 tablet (50 mcg total) by mouth daily before breakfast.     Endocrinology:  Hypothyroid Agents Passed - 08/14/2023  1:57 PM      Passed - TSH in normal range and within 360 days    TSH  Date Value Ref Range Status  02/05/2023 1.860 0.450 - 4.500 uIU/mL Final         Passed - Valid encounter within last 12 months    Recent Outpatient Visits           6 months ago Vitamin D  deficiency   [facility] [place]. [facility] [place], [place], [place]   1 year ago Hypothyroidism, unspecified type   [facility] Of [place]. [facility] Brien Belvie BRAVO, MD   1 year ago Vitamin D  deficiency   [facility] Dept Of [place]. [facility] Delbert Clam, MD   2 years ago Vitamin D  deficiency   [facility] - [facility] Dept Of [place]. [facility] Brien Belvie BRAVO, MD   2 years ago Vitamin D  deficiency   [facility] - [facility] Dept Of [place]. [facility] Linnell Camp, [place], PA-C

## 2023-08-19 ENCOUNTER — Other Ambulatory Visit: Payer: Self-pay | Admitting: Physician Assistant

## 2023-08-19 ENCOUNTER — Other Ambulatory Visit: Payer: Self-pay | Admitting: Family Medicine

## 2023-08-19 ENCOUNTER — Other Ambulatory Visit: Payer: Self-pay

## 2023-08-19 ENCOUNTER — Other Ambulatory Visit (HOSPITAL_COMMUNITY): Payer: Self-pay

## 2023-08-19 DIAGNOSIS — Z8619 Personal history of other infectious and parasitic diseases: Secondary | ICD-10-CM

## 2023-08-19 DIAGNOSIS — J302 Other seasonal allergic rhinitis: Secondary | ICD-10-CM

## 2023-08-19 MED ORDER — FLUTICASONE PROPIONATE 50 MCG/ACT NA SUSP
2.0000 | Freq: Every day | NASAL | 0 refills | Status: DC
Start: 2023-08-19 — End: 2023-11-26
  Filled 2023-08-19: qty 16, 30d supply, fill #0

## 2023-08-19 NOTE — Telephone Encounter (Signed)
 Patient has no previous encounters with me or at [facility]. Please send Valacyclovir  refill request to patient's primary provider. Thank you.

## 2023-08-20 ENCOUNTER — Other Ambulatory Visit: Payer: Self-pay

## 2023-08-25 ENCOUNTER — Other Ambulatory Visit (HOSPITAL_COMMUNITY): Payer: Self-pay

## 2023-08-25 ENCOUNTER — Other Ambulatory Visit: Payer: Self-pay

## 2023-08-25 MED ORDER — VALACYCLOVIR HCL 500 MG PO TABS
500.0000 mg | ORAL_TABLET | Freq: Two times a day (BID) | ORAL | 0 refills | Status: DC
  Filled 2023-08-25: qty 30, 15d supply, fill #0

## 2023-09-08 NOTE — Progress Notes (Deleted)
 Established Patient Office Visit  Subjective:  Patient ID: Veronica Jensen, female    DOB: 09/03/1975  Age: 48 y.o. MRN: [id number]  CC:  No chief complaint on file.   HPI 03/2021 Veronica Jensen, provided Spanish language interpretation services today.  Veronica Jensen presents for primary care follow up. She last saw Jon Moores, PA-C in November 2022 for hypothyroidism and Vitamin D  deficiency. At that time, her TSH and T4 levels were tested and within normal range on levothyroxine  50 mcg. She was Vitamin D  deficient and was prescribed weekly Vitamin D  at 50,000 IU.  Today she reports that she is doing well on her Synthroid  and Vitamin D . She requests refills for these medications. She is also inquiring if there is a cream available to treat her genital herpes. She takes Valacyclovir  for this, but is looking for something that will treat the symptoms of itching and irritation.  Her only complaint today is some throat irritation. She states that her throat feels full, and it can make it difficult to swallow sometimes. She has taken Flonase  and cetirizine  in the past for seasonal allergies, but is not currently taking any medications for allergies.  She is due for colon cancer screening and plans on obtaining the FOBT kit from the lab. She also is due for tDAP vaccination which she will get today.   07/16/22 Patient seen in follow-up and not been seen since early last year but Dr. Newlin did see her more recently.  Visit obtained with Spanish video interpreter [place] [phone].  The patient is planning on becoming married and would like birth control for at least 3 months after marriage.  She would also like a pregnancy test at this visit and vitamin D  levels checked.  She does have bilateral periorbital swelling.  This did not change with Zyrtec  given by Dr. Erick at the November visit.  There are no other complaints.  09/10/23 Hypothyroidism, unspecified type -  Thyroid  Panel With TSH - SYNTHROID  50 MCG tablet; Take 1 tablet (50 mcg total) by mouth daily before breakfast.  Dispense: 90 tablet; Refill: 2   2. Vitamin D  deficiency (Primary) Has been off prescription about 3 months - Vitamin D , 25-hydroxy   3. Language barrier Marlon with interpreters used and additional time performing visit was required.   4. Right hip pain - methocarbamol  (ROBAXIN ) 500 MG tablet; Take 2 tablets (1,000 mg total) by mouth every 8 (eight) hours as needed.  Dispense: 90 tablet; Refill: 0 - Ambulatory referral to Orthopedic Surgery   5. History of herpes genitalis - valACYclovir  (VALTREX ) 500 MG tablet; Take 1 tablet (500 mg total) by mouth 2 (two) times daily. prn  Dispense: 60 tablet; Refill: 1 - acyclovir  ointment (ZOVIRAX ) 5 %; Apply 1 Application topically every 6 (six) hours as needed.  Dispense: 30 g; Refill: 2   6. Seasonal allergies - fluticasone  (FLONASE ) 50 MCG/ACT nasal spray; PLACE 2 SPRAYS INTO BOTH NOSTRILS DAILY.  Dispense: 16 g; Refill: 0    Past Medical History:  Diagnosis Date   Abdominal pain    Asthma in adult without complication    Chest pain    Chronic neck pain    Hyperthyroidism    Language barrier    Mood changes    Nausea    Ovarian cyst    Shortness of breath    Varicose veins of both lower extremities     Past Surgical History:  Procedure Laterality Date   MYOMECTOMY  Family History  Problem Relation Age of Onset   Thyroid  disease Mother    Hypercholesterolemia Father     Social History   Socioeconomic History   Marital status: Married    Spouse name: Not on file   Number of children: Not on file   Years of education: Not on file   Highest education level: Bachelor's degree (e.g., BA, AB, BS)  Occupational History   Not on file  Tobacco Use   Smoking status: Never    Passive exposure: Never   Smokeless tobacco: Never  Vaping Use   Vaping status: Never Used  Substance and Sexual Activity   Alcohol  use: Not Currently   Drug use: Not Currently   Sexual activity: Not Currently    Birth control/protection: None  Other Topics Concern   Not on file  Social History Narrative   Not on file   Social Drivers of Health   Financial Resource Strain: Not on file  Food Insecurity: No Food Insecurity (03/13/2020)   Hunger Vital Sign    Worried About Running Out of Food in the Last Year: Never true    Ran Out of Food in the Last Year: Never true  Transportation Needs: No Transportation Needs (03/13/2020)   PRAPARE - Administrator, Civil Service (Medical): No    Lack of Transportation (Non-Medical): No  Physical Activity: Not on file  Stress: Not on file  Social Connections: Not on file  Intimate Partner Violence: Not on file    Outpatient Medications Prior to Visit  Medication Sig Dispense Refill   acyclovir  ointment (ZOVIRAX ) 5 % Apply 1 Application topically every 6 (six) hours as needed. 30 g 2   diclofenac  (VOLTAREN ) 75 MG EC tablet Take 1 tablet (75 mg total) by mouth 2 (two) times daily as needed. 60 tablet 2   fluticasone  (FLONASE ) 50 MCG/ACT nasal spray Place 2 sprays into both nostrils daily. 16 g 0   methocarbamol  (ROBAXIN ) 500 MG tablet Take 2 tablets (1,000 mg total) by mouth every 8 (eight) hours as needed. 90 tablet 0   SYNTHROID  50 MCG tablet Take 1 tablet (50 mcg total) by mouth daily before breakfast. 90 tablet 2   valACYclovir  (VALTREX ) 500 MG tablet Take 1 tablet (500 mg total) by mouth 2 (two) times daily. Office visit req for future refills 30 tablet 0   Vitamin D , Ergocalciferol , (DRISDOL ) 1.25 MG (50000 UNIT) CAPS capsule Take 1 capsule (50,000 Units total) by mouth every 7 (seven) days. 4 capsule 0   No facility-administered medications prior to visit.    Allergies  Allergen Reactions   Anesthesia S-I-40 [Propofol] Other (See Comments)    Hypotension    ROS Review of Systems  Constitutional:  Negative for fatigue and unexpected weight change.   HENT:  Negative for congestion, sore throat and trouble swallowing (throat feels tight).   Eyes: Negative.        Right orbital edema  Respiratory:  Negative for cough and shortness of breath.   Gastrointestinal:  Negative for diarrhea, nausea and vomiting.  Endocrine: Negative for cold intolerance.  Genitourinary:  Negative for dysuria and genital sores.  Musculoskeletal:  Negative for arthralgias and myalgias.  Skin:  Negative for rash.  Neurological:  Negative for dizziness and headaches.  Psychiatric/Behavioral: Negative.  Negative for dysphoric mood. The patient is not nervous/anxious.       Objective:    Physical Exam Constitutional:      Appearance: Normal appearance. She is normal  weight.  HENT:     Head: Normocephalic and atraumatic.     Right Ear: External ear normal.     Left Ear: External ear normal.     Mouth/Throat:     Mouth: Mucous membranes are moist.     Dentition: Normal dentition. No dental caries.     Pharynx: Oropharynx is clear. No posterior oropharyngeal erythema.  Eyes:     General: No scleral icterus.    Extraocular Movements: Extraocular movements intact.     Conjunctiva/sclera: Conjunctivae normal.     Pupils: Pupils are equal, round, and reactive to light.     Comments: Periorbital edema  Neck:     Thyroid : No thyroid  mass, thyromegaly or thyroid  tenderness.  Cardiovascular:     Rate and Rhythm: Normal rate and regular rhythm.     Pulses: Normal pulses.     Heart sounds: Normal heart sounds. No murmur heard.    No friction rub. No gallop.  Pulmonary:     Effort: Pulmonary effort is normal.     Breath sounds: Normal breath sounds. No wheezing, rhonchi or rales.  Abdominal:     General: Abdomen is flat.  Musculoskeletal:        General: No swelling. Normal range of motion.     Cervical back: Normal range of motion and neck supple. No tenderness.     Right lower leg: No edema.     Left lower leg: No edema.  Skin:    General: Skin is warm  and dry.  Neurological:     General: No focal deficit present.     Mental Status: She is alert and oriented to person, place, and time.  Psychiatric:        Mood and Affect: Mood normal.        Behavior: Behavior normal.        Thought Content: Thought content normal.        Judgment: Judgment normal.     There were no vitals taken for this visit. Wt Readings from Last 3 Encounters:  03/16/23 142 lb 6.4 oz (64.6 kg)  02/05/23 143 lb 12.8 oz (65.2 kg)  07/16/22 143 lb (64.9 kg)     Health Maintenance Due  Topic Date Due   Pneumococcal Vaccine 61-49 Years old (1 of 2 - PCV) Never done   Hepatitis B Vaccines (1 of 3 - 19+ 3-dose series) Never done   Cervical Cancer Screening (HPV/Pap Cotest)  Never done   COLON CANCER SCREENING ANNUAL FOBT  03/28/2022       Topic Date Due   Hepatitis B Vaccines (1 of 3 - 19+ 3-dose series) Never done    Lab Results  Component Value Date   TSH 1.860 02/05/2023   Lab Results  Component Value Date   WBC 5.5 12/31/2021   HGB 13.1 12/31/2021   HCT 39.4 12/31/2021   MCV 79 12/31/2021   PLT 289 12/31/2021   Lab Results  Component Value Date   NA 138 12/31/2021   K 4.1 12/31/2021   CO2 22 12/31/2021   GLUCOSE 100 (H) 12/31/2021   BUN 14 12/31/2021   CREATININE 0.88 12/31/2021   BILITOT 0.5 12/31/2021   ALKPHOS 61 12/31/2021   AST 17 12/31/2021   ALT 13 12/31/2021   PROT 6.7 12/31/2021   ALBUMIN 4.5 12/31/2021   CALCIUM 9.3 12/31/2021   ANIONGAP 10 06/08/2018   EGFR 82 12/31/2021   Lab Results  Component Value Date   CHOL 195 12/31/2021  Lab Results  Component Value Date   HDL 68 12/31/2021   Lab Results  Component Value Date   LDLCALC 115 (H) 12/31/2021   Lab Results  Component Value Date   TRIG 66 12/31/2021   Lab Results  Component Value Date   CHOLHDL 2.9 06/01/2019   Lab Results  Component Value Date   HGBA1C 5.5 12/31/2021      Assessment & Plan:   Problem List Items Addressed This Visit    None   No orders of the defined types were placed in this encounter.  38 minutes spent attaining history and physical navigating language barrier complex decision making multiple systems assessed Follow-up: No follow-ups on file.    Belvie Silvan, MD

## 2023-09-10 ENCOUNTER — Ambulatory Visit: Admitting: Critical Care Medicine

## 2023-09-27 NOTE — Progress Notes (Deleted)
 Established Patient Office Visit  Subjective:  Patient ID: Veronica Jensen, female    DOB: 03-04-1975  Age: 48 y.o. MRN: [id number]  CC:  No chief complaint on file.   HPI 03/2021 Veronica Jensen, provided Spanish language interpretation services today.  Veronica Jensen presents for primary care follow up. She last saw Jon Moores, PA-C in November 2022 for hypothyroidism and Vitamin D  deficiency. At that time, her TSH and T4 levels were tested and within normal range on levothyroxine  50 mcg. She was Vitamin D  deficient and was prescribed weekly Vitamin D  at 50,000 IU.  Today she reports that she is doing well on her Synthroid  and Vitamin D . She requests refills for these medications. She is also inquiring if there is a cream available to treat her genital herpes. She takes Valacyclovir  for this, but is looking for something that will treat the symptoms of itching and irritation.  Her only complaint today is some throat irritation. She states that her throat feels full, and it can make it difficult to swallow sometimes. She has taken Flonase  and cetirizine  in the past for seasonal allergies, but is not currently taking any medications for allergies.  She is due for colon cancer screening and plans on obtaining the FOBT kit from the lab. She also is due for tDAP vaccination which she will get today.   07/16/22 Patient seen in follow-up and not been seen since early last year but Dr. Newlin did see her more recently.  Visit obtained with Spanish video interpreter [phone] [phone].  The patient is planning on becoming married and would like birth control for at least 3 months after marriage.  She would also like a pregnancy test at this visit and vitamin D  levels checked.  She does have bilateral periorbital swelling.  This did not change with Zyrtec  given by Dr. Erick at the November visit.  There are no other complaints.  10/01/23 Hypothyroidism, unspecified type -  Thyroid  Panel With TSH - SYNTHROID  50 MCG tablet; Take 1 tablet (50 mcg total) by mouth daily before breakfast.  Dispense: 90 tablet; Refill: 2   2. Vitamin D  deficiency (Primary) Has been off prescription about 3 months - Vitamin D , 25-hydroxy   3. Language barrier [place] with interpreters used and additional time performing visit was required.   4. Right hip pain - methocarbamol  (ROBAXIN ) 500 MG tablet; Take 2 tablets (1,000 mg total) by mouth every 8 (eight) hours as needed.  Dispense: 90 tablet; Refill: 0 - Ambulatory referral to Orthopedic Surgery   5. History of herpes genitalis - valACYclovir  (VALTREX ) 500 MG tablet; Take 1 tablet (500 mg total) by mouth 2 (two) times daily. prn  Dispense: 60 tablet; Refill: 1 - acyclovir  ointment (ZOVIRAX ) 5 %; Apply 1 Application topically every 6 (six) hours as needed.  Dispense: 30 g; Refill: 2   6. Seasonal allergies - fluticasone  (FLONASE ) 50 MCG/ACT nasal spray; PLACE 2 SPRAYS INTO BOTH NOSTRILS DAILY.  Dispense: 16 g; Refill: 0    Past Medical History:  Diagnosis Date   Abdominal pain    Asthma in adult without complication    Chest pain    Chronic neck pain    Hyperthyroidism    Language barrier    Mood changes    Nausea    Ovarian cyst    Shortness of breath    Varicose veins of both lower extremities     Past Surgical History:  Procedure Laterality Date   MYOMECTOMY  Family History  Problem Relation Age of Onset   Thyroid  disease Mother    Hypercholesterolemia Father     Social History   Socioeconomic History   Marital status: Married    Spouse name: Not on file   Number of children: Not on file   Years of education: Not on file   Highest education level: Bachelor's degree (e.g., BA, AB, BS)  Occupational History   Not on file  Tobacco Use   Smoking status: Never    Passive exposure: Never   Smokeless tobacco: Never  Vaping Use   Vaping status: Never Used  Substance and Sexual Activity   Alcohol  use: Not Currently   Drug use: Not Currently   Sexual activity: Not Currently    Birth control/protection: None  Other Topics Concern   Not on file  Social History Narrative   Not on file   Social Drivers of Health   Financial Resource Strain: Not on file  Food Insecurity: No Food Insecurity (03/13/2020)   Hunger Vital Sign    Worried About Running Out of Food in the Last Year: Never true    Ran Out of Food in the Last Year: Never true  Transportation Needs: No Transportation Needs (03/13/2020)   PRAPARE - Administrator, Civil Service (Medical): No    Lack of Transportation (Non-Medical): No  Physical Activity: Not on file  Stress: Not on file  Social Connections: Not on file  Intimate Partner Violence: Not on file    Outpatient Medications Prior to Visit  Medication Sig Dispense Refill   acyclovir  ointment (ZOVIRAX ) 5 % Apply 1 Application topically every 6 (six) hours as needed. 30 g 2   diclofenac  (VOLTAREN ) 75 MG EC tablet Take 1 tablet (75 mg total) by mouth 2 (two) times daily as needed. 60 tablet 2   fluticasone  (FLONASE ) 50 MCG/ACT nasal spray Place 2 sprays into both nostrils daily. 16 g 0   methocarbamol  (ROBAXIN ) 500 MG tablet Take 2 tablets (1,000 mg total) by mouth every 8 (eight) hours as needed. 90 tablet 0   SYNTHROID  50 MCG tablet Take 1 tablet (50 mcg total) by mouth daily before breakfast. 90 tablet 2   valACYclovir  (VALTREX ) 500 MG tablet Take 1 tablet (500 mg total) by mouth 2 (two) times daily. Office visit req for future refills 30 tablet 0   Vitamin D , Ergocalciferol , (DRISDOL ) 1.25 MG (50000 UNIT) CAPS capsule Take 1 capsule (50,000 Units total) by mouth every 7 (seven) days. 4 capsule 0   No facility-administered medications prior to visit.    Allergies  Allergen Reactions   Anesthesia S-I-40 [Propofol] Other (See Comments)    Hypotension    ROS Review of Systems  Constitutional:  Negative for fatigue and unexpected weight change.   HENT:  Negative for congestion, sore throat and trouble swallowing (throat feels tight).   Eyes: Negative.        Right orbital edema  Respiratory:  Negative for cough and shortness of breath.   Gastrointestinal:  Negative for diarrhea, nausea and vomiting.  Endocrine: Negative for cold intolerance.  Genitourinary:  Negative for dysuria and genital sores.  Musculoskeletal:  Negative for arthralgias and myalgias.  Skin:  Negative for rash.  Neurological:  Negative for dizziness and headaches.  Psychiatric/Behavioral: Negative.  Negative for dysphoric mood. The patient is not nervous/anxious.       Objective:    Physical Exam Constitutional:      Appearance: Normal appearance. She is normal  weight.  HENT:     Head: Normocephalic and atraumatic.     Right Ear: External ear normal.     Left Ear: External ear normal.     Mouth/Throat:     Mouth: Mucous membranes are moist.     Dentition: Normal dentition. No dental caries.     Pharynx: Oropharynx is clear. No posterior oropharyngeal erythema.  Eyes:     General: No scleral icterus.    Extraocular Movements: Extraocular movements intact.     Conjunctiva/sclera: Conjunctivae normal.     Pupils: Pupils are equal, round, and reactive to light.     Comments: Periorbital edema  Neck:     Thyroid : No thyroid  mass, thyromegaly or thyroid  tenderness.  Cardiovascular:     Rate and Rhythm: Normal rate and regular rhythm.     Pulses: Normal pulses.     Heart sounds: Normal heart sounds. No murmur heard.    No friction rub. No gallop.  Pulmonary:     Effort: Pulmonary effort is normal.     Breath sounds: Normal breath sounds. No wheezing, rhonchi or rales.  Abdominal:     General: Abdomen is flat.  Musculoskeletal:        General: No swelling. Normal range of motion.     Cervical back: Normal range of motion and neck supple. No tenderness.     Right lower leg: No edema.     Left lower leg: No edema.  Skin:    General: Skin is warm  and dry.  Neurological:     General: No focal deficit present.     Mental Status: She is alert and oriented to person, place, and time.  Psychiatric:        Mood and Affect: Mood normal.        Behavior: Behavior normal.        Thought Content: Thought content normal.        Judgment: Judgment normal.     There were no vitals taken for this visit. Wt Readings from Last 3 Encounters:  03/16/23 142 lb 6.4 oz (64.6 kg)  02/05/23 143 lb 12.8 oz (65.2 kg)  07/16/22 143 lb (64.9 kg)     Health Maintenance Due  Topic Date Due   Pneumococcal Vaccine: 19-49 Years (1 of 2 - PCV) Never done   Hepatitis B Vaccines (1 of 3 - 19+ 3-dose series) Never done   Cervical Cancer Screening (HPV/Pap Cotest)  Never done   COLON CANCER SCREENING ANNUAL FOBT  03/28/2022   INFLUENZA VACCINE  09/18/2023       Topic Date Due   Hepatitis B Vaccines (1 of 3 - 19+ 3-dose series) Never done    Lab Results  Component Value Date   TSH 1.860 02/05/2023   Lab Results  Component Value Date   WBC 5.5 12/31/2021   HGB 13.1 12/31/2021   HCT 39.4 12/31/2021   MCV 79 12/31/2021   PLT 289 12/31/2021   Lab Results  Component Value Date   NA 138 12/31/2021   K 4.1 12/31/2021   CO2 22 12/31/2021   GLUCOSE 100 (H) 12/31/2021   BUN 14 12/31/2021   CREATININE 0.88 12/31/2021   BILITOT 0.5 12/31/2021   ALKPHOS 61 12/31/2021   AST 17 12/31/2021   ALT 13 12/31/2021   PROT 6.7 12/31/2021   ALBUMIN 4.5 12/31/2021   CALCIUM 9.3 12/31/2021   ANIONGAP 10 06/08/2018   EGFR 82 12/31/2021   Lab Results  Component Value Date  CHOL 195 12/31/2021   Lab Results  Component Value Date   HDL 68 12/31/2021   Lab Results  Component Value Date   LDLCALC 115 (H) 12/31/2021   Lab Results  Component Value Date   TRIG 66 12/31/2021   Lab Results  Component Value Date   CHOLHDL 2.9 06/01/2019   Lab Results  Component Value Date   HGBA1C 5.5 12/31/2021      Assessment & Plan:   Problem List Items  Addressed This Visit   None   No orders of the defined types were placed in this encounter.  38 minutes spent attaining history and physical navigating language barrier complex decision making multiple systems assessed Follow-up: No follow-ups on file.    Belvie Silvan, MD

## 2023-10-01 ENCOUNTER — Ambulatory Visit: Admitting: Critical Care Medicine

## 2023-11-11 NOTE — Progress Notes (Unsigned)
 Established Patient Office Visit  Subjective:  Patient ID: Veronica Jensen, female    DOB: 1975-12-30  Age: 48 y.o. MRN: [id number]  CC:  No chief complaint on file.   HPI 03/2021 [name] [id number], provided Spanish language interpretation services today.  Veronica Jensen presents for primary care follow up. She last saw Jon Moores, PA-C in November 2022 for hypothyroidism and Vitamin D  deficiency. At that time, her TSH and T4 levels were tested and within normal range on levothyroxine  50 mcg. She was Vitamin D  deficient and was prescribed weekly Vitamin D  at 50,000 IU.  Today she reports that she is doing well on her Synthroid  and Vitamin D . She requests refills for these medications. She is also inquiring if there is a cream available to treat her genital herpes. She takes Valacyclovir  for this, but is looking for something that will treat the symptoms of itching and irritation.  Her only complaint today is some throat irritation. She states that her throat feels full, and it can make it difficult to swallow sometimes. She has taken Flonase  and cetirizine  in the past for seasonal allergies, but is not currently taking any medications for allergies.  She is due for colon cancer screening and plans on obtaining the FOBT kit from the lab. She also is due for tDAP vaccination which she will get today.   07/16/22 Patient seen in follow-up and not been seen since early last year but Dr. Newlin did see her more recently.  Visit obtained with Spanish video interpreter [place] [phone].  The patient is planning on becoming married and would like birth control for at least 3 months after marriage.  She would also like a pregnancy test at this visit and vitamin D  levels checked.  She does have bilateral periorbital swelling.  This did not change with Zyrtec  given by Dr. Erick at the November visit.  There are no other complaints.  11/12/23  Past Medical History:  Diagnosis Date    Abdominal pain    Asthma in adult without complication    Chest pain    Chronic neck pain    Hyperthyroidism    Language barrier    Mood changes    Nausea    Ovarian cyst    Shortness of breath    Varicose veins of both lower extremities     Past Surgical History:  Procedure Laterality Date   MYOMECTOMY      Family History  Problem Relation Age of Onset   Thyroid  disease Mother    Hypercholesterolemia Father     Social History   Socioeconomic History   Marital status: Married    Spouse name: Not on file   Number of children: Not on file   Years of education: Not on file   Highest education level: Bachelor's degree (e.g., BA, AB, BS)  Occupational History   Not on file  Tobacco Use   Smoking status: Never    Passive exposure: Never   Smokeless tobacco: Never  Vaping Use   Vaping status: Never Used  Substance and Sexual Activity   Alcohol use: Not Currently   Drug use: Not Currently   Sexual activity: Not Currently    Birth control/protection: None  Other Topics Concern   Not on file  Social History Narrative   Not on file   Social Drivers of Health   Financial Resource Strain: Not on file  Food Insecurity: No Food Insecurity (03/13/2020)   Hunger Vital Sign    Worried  About Running Out of Food in the Last Year: Never true    Ran Out of Food in the Last Year: Never true  Transportation Needs: No Transportation Needs (03/13/2020)   PRAPARE - Administrator, Civil Service (Medical): No    Lack of Transportation (Non-Medical): No  Physical Activity: Not on file  Stress: Not on file  Social Connections: Not on file  Intimate Partner Violence: Not on file    Outpatient Medications Prior to Visit  Medication Sig Dispense Refill   acyclovir  ointment (ZOVIRAX ) 5 % Apply 1 Application topically every 6 (six) hours as needed. 30 g 2   diclofenac  (VOLTAREN ) 75 MG EC tablet Take 1 tablet (75 mg total) by mouth 2 (two) times daily as needed. 60  tablet 2   fluticasone  (FLONASE ) 50 MCG/ACT nasal spray Place 2 sprays into both nostrils daily. 16 g 0   methocarbamol  (ROBAXIN ) 500 MG tablet Take 2 tablets (1,000 mg total) by mouth every 8 (eight) hours as needed. 90 tablet 0   SYNTHROID  50 MCG tablet Take 1 tablet (50 mcg total) by mouth daily before breakfast. 90 tablet 2   valACYclovir  (VALTREX ) 500 MG tablet Take 1 tablet (500 mg total) by mouth 2 (two) times daily. Office visit req for future refills 30 tablet 0   Vitamin D , Ergocalciferol , (DRISDOL ) 1.25 MG (50000 UNIT) CAPS capsule Take 1 capsule (50,000 Units total) by mouth every 7 (seven) days. 4 capsule 0   No facility-administered medications prior to visit.    Allergies  Allergen Reactions   Anesthesia S-I-40 [Propofol] Other (See Comments)    Hypotension    ROS Review of Systems  Constitutional:  Negative for fatigue and unexpected weight change.  HENT:  Negative for congestion, sore throat and trouble swallowing (throat feels tight).   Eyes: Negative.        Right orbital edema  Respiratory:  Negative for cough and shortness of breath.   Gastrointestinal:  Negative for diarrhea, nausea and vomiting.  Endocrine: Negative for cold intolerance.  Genitourinary:  Negative for dysuria and genital sores.  Musculoskeletal:  Negative for arthralgias and myalgias.  Skin:  Negative for rash.  Neurological:  Negative for dizziness and headaches.  Psychiatric/Behavioral: Negative.  Negative for dysphoric mood. The patient is not nervous/anxious.       Objective:    Physical Exam Constitutional:      Appearance: Normal appearance. She is normal weight.  HENT:     Head: Normocephalic and atraumatic.     Right Ear: External ear normal.     Left Ear: External ear normal.     Mouth/Throat:     Mouth: Mucous membranes are moist.     Dentition: Normal dentition. No dental caries.     Pharynx: Oropharynx is clear. No posterior oropharyngeal erythema.  Eyes:     General:  No scleral icterus.    Extraocular Movements: Extraocular movements intact.     Conjunctiva/sclera: Conjunctivae normal.     Pupils: Pupils are equal, round, and reactive to light.     Comments: Periorbital edema  Neck:     Thyroid : No thyroid  mass, thyromegaly or thyroid  tenderness.  Cardiovascular:     Rate and Rhythm: Normal rate and regular rhythm.     Pulses: Normal pulses.     Heart sounds: Normal heart sounds. No murmur heard.    No friction rub. No gallop.  Pulmonary:     Effort: Pulmonary effort is normal.     Breath  sounds: Normal breath sounds. No wheezing, rhonchi or rales.  Abdominal:     General: Abdomen is flat.  Musculoskeletal:        General: No swelling. Normal range of motion.     Cervical back: Normal range of motion and neck supple. No tenderness.     Right lower leg: No edema.     Left lower leg: No edema.  Skin:    General: Skin is warm and dry.  Neurological:     General: No focal deficit present.     Mental Status: She is alert and oriented to person, place, and time.  Psychiatric:        Mood and Affect: Mood normal.        Behavior: Behavior normal.        Thought Content: Thought content normal.        Judgment: Judgment normal.     There were no vitals taken for this visit. Wt Readings from Last 3 Encounters:  03/16/23 142 lb 6.4 oz (64.6 kg)  02/05/23 143 lb 12.8 oz (65.2 kg)  07/16/22 143 lb (64.9 kg)     Health Maintenance Due  Topic Date Due   Pneumococcal Vaccine (1 of 2 - PCV) Never done   Hepatitis B Vaccines 19-59 Average Risk (1 of 3 - 19+ 3-dose series) Never done   Cervical Cancer Screening (HPV/Pap Cotest)  Never done   Mammogram  02/06/2022   COLON CANCER SCREENING ANNUAL FOBT  03/28/2022   Influenza Vaccine  Never done       Topic Date Due   Hepatitis B Vaccines 19-59 Average Risk (1 of 3 - 19+ 3-dose series) Never done    Lab Results  Component Value Date   TSH 1.860 02/05/2023   Lab Results  Component Value  Date   WBC 5.5 12/31/2021   HGB 13.1 12/31/2021   HCT 39.4 12/31/2021   MCV 79 12/31/2021   PLT 289 12/31/2021   Lab Results  Component Value Date   NA 138 12/31/2021   K 4.1 12/31/2021   CO2 22 12/31/2021   GLUCOSE 100 (H) 12/31/2021   BUN 14 12/31/2021   CREATININE 0.88 12/31/2021   BILITOT 0.5 12/31/2021   ALKPHOS 61 12/31/2021   AST 17 12/31/2021   ALT 13 12/31/2021   PROT 6.7 12/31/2021   ALBUMIN 4.5 12/31/2021   CALCIUM 9.3 12/31/2021   ANIONGAP 10 06/08/2018   EGFR 82 12/31/2021   Lab Results  Component Value Date   CHOL 195 12/31/2021   Lab Results  Component Value Date   HDL 68 12/31/2021   Lab Results  Component Value Date   LDLCALC 115 (H) 12/31/2021   Lab Results  Component Value Date   TRIG 66 12/31/2021   Lab Results  Component Value Date   CHOLHDL 2.9 06/01/2019   Lab Results  Component Value Date   HGBA1C 5.5 12/31/2021      Assessment & Plan:   Problem List Items Addressed This Visit   None   No orders of the defined types were placed in this encounter.  38 minutes spent attaining history and physical navigating language barrier complex decision making multiple systems assessed Follow-up: No follow-ups on file.    Belvie Silvan, MD

## 2023-11-12 ENCOUNTER — Ambulatory Visit: Attending: Critical Care Medicine | Admitting: Critical Care Medicine

## 2023-11-12 ENCOUNTER — Encounter: Payer: Self-pay | Admitting: Critical Care Medicine

## 2023-11-12 ENCOUNTER — Other Ambulatory Visit: Payer: Self-pay

## 2023-11-12 VITALS — BP 98/66 | HR 60 | Ht 63.0 in | Wt 139.2 lb

## 2023-11-12 DIAGNOSIS — J301 Allergic rhinitis due to pollen: Secondary | ICD-10-CM

## 2023-11-12 DIAGNOSIS — E039 Hypothyroidism, unspecified: Secondary | ICD-10-CM | POA: Diagnosis not present

## 2023-11-12 DIAGNOSIS — Z139 Encounter for screening, unspecified: Secondary | ICD-10-CM | POA: Diagnosis not present

## 2023-11-12 DIAGNOSIS — R600 Localized edema: Secondary | ICD-10-CM

## 2023-11-12 DIAGNOSIS — Z124 Encounter for screening for malignant neoplasm of cervix: Secondary | ICD-10-CM

## 2023-11-12 DIAGNOSIS — I83813 Varicose veins of bilateral lower extremities with pain: Secondary | ICD-10-CM

## 2023-11-12 DIAGNOSIS — N951 Menopausal and female climacteric states: Secondary | ICD-10-CM

## 2023-11-12 DIAGNOSIS — E559 Vitamin D deficiency, unspecified: Secondary | ICD-10-CM | POA: Diagnosis not present

## 2023-11-12 DIAGNOSIS — J4520 Mild intermittent asthma, uncomplicated: Secondary | ICD-10-CM

## 2023-11-12 DIAGNOSIS — A6009 Herpesviral infection of other urogenital tract: Secondary | ICD-10-CM

## 2023-11-12 DIAGNOSIS — Z1211 Encounter for screening for malignant neoplasm of colon: Secondary | ICD-10-CM

## 2023-11-12 DIAGNOSIS — M8589 Other specified disorders of bone density and structure, multiple sites: Secondary | ICD-10-CM

## 2023-11-12 MED ORDER — ACYCLOVIR 400 MG PO TABS
400.0000 mg | ORAL_TABLET | Freq: Every day | ORAL | 1 refills | Status: AC
  Filled 2023-11-12: qty 30, 30d supply, fill #0
  Filled 2023-11-12: qty 60, 60d supply, fill #0
  Filled 2023-11-26 – 2023-12-20 (×2): qty 30, 30d supply, fill #1
  Filled 2024-01-12: qty 30, 30d supply, fill #2
  Filled 2024-02-24: qty 30, 30d supply, fill #3

## 2023-11-12 MED ORDER — PANTOPRAZOLE SODIUM 40 MG PO TBEC
40.0000 mg | DELAYED_RELEASE_TABLET | Freq: Two times a day (BID) | ORAL | 3 refills | Status: AC
  Filled 2023-11-12 (×3): qty 60, 30d supply, fill #0
  Filled 2023-11-26 – 2023-12-20 (×2): qty 60, 30d supply, fill #1

## 2023-11-12 MED ORDER — ESCITALOPRAM OXALATE 5 MG PO TABS
5.0000 mg | ORAL_TABLET | Freq: Every day | ORAL | 1 refills | Status: AC
  Filled 2023-11-12: qty 28, 28d supply, fill #0
  Filled 2023-11-12 (×3): qty 30, 30d supply, fill #0
  Filled 2023-11-26 – 2023-12-20 (×2): qty 30, 30d supply, fill #1

## 2023-11-12 MED ORDER — AZELASTINE HCL 0.1 % NA SOLN
2.0000 | Freq: Two times a day (BID) | NASAL | 12 refills | Status: AC
  Filled 2023-11-12: qty 30, 50d supply, fill #0
  Filled 2023-11-12: qty 30, 25d supply, fill #0
  Filled 2023-11-12: qty 30, 50d supply, fill #0
  Filled 2023-11-26 – 2023-12-20 (×2): qty 30, 25d supply, fill #1

## 2023-11-12 NOTE — Patient Instructions (Addendum)
 Screening health labs will be obtained including vitamin D  level Take the acyclovir  once daily on a regular basis to suppress the herpes infections Colon cancer screening kit was given Start Astepro  Astelin  2 sprays each nostril daily Start low-dose escitalopram  to reduce hot flashes daily Start pantoprazole  1 twice daily before meals for gastritis Return for clinic follow-up 6 months  Se realizarn anlisis de laboratorio, incluyendo el nivel de vitamina D. Tome aciclovir una vez al da con regularidad para suprimir las infecciones por herpes. Se le proporcion un kit de deteccin de cncer de colon. Comience a tomar Astepro  Astelin  2 pulverizaciones por fosa nasal al da. Comience a tomar escitalopram  en dosis baja para reducir los sofocos a diario. Comience a tomar pantoprazol 1 vez al da antes de las comidas para la gastritis. Regrese para seguimiento clnico a los 6 meses.

## 2023-11-13 ENCOUNTER — Other Ambulatory Visit: Payer: Self-pay

## 2023-11-13 ENCOUNTER — Ambulatory Visit: Payer: Self-pay | Admitting: Critical Care Medicine

## 2023-11-13 ENCOUNTER — Encounter: Payer: Self-pay | Admitting: Critical Care Medicine

## 2023-11-13 DIAGNOSIS — Z1211 Encounter for screening for malignant neoplasm of colon: Secondary | ICD-10-CM | POA: Insufficient documentation

## 2023-11-13 DIAGNOSIS — N951 Menopausal and female climacteric states: Secondary | ICD-10-CM | POA: Insufficient documentation

## 2023-11-13 DIAGNOSIS — A6009 Herpesviral infection of other urogenital tract: Secondary | ICD-10-CM | POA: Insufficient documentation

## 2023-11-13 DIAGNOSIS — M8589 Other specified disorders of bone density and structure, multiple sites: Secondary | ICD-10-CM | POA: Insufficient documentation

## 2023-11-13 LAB — THYROID PANEL WITH TSH
Free Thyroxine Index: 2.4 (ref 1.2–4.9)
T3 Uptake Ratio: 27 % (ref 24–39)
T4, Total: 8.8 ug/dL (ref 4.5–12.0)
TSH: 1.66 u[IU]/mL (ref 0.450–4.500)

## 2023-11-13 LAB — CBC WITH DIFFERENTIAL/PLATELET
Basophils Absolute: 0.1 x10E3/uL (ref 0.0–0.2)
Basos: 1 %
EOS (ABSOLUTE): 0.1 x10E3/uL (ref 0.0–0.4)
Eos: 2 %
Hematocrit: 39.5 % (ref 34.0–46.6)
Hemoglobin: 12.8 g/dL (ref 11.1–15.9)
Immature Grans (Abs): 0 x10E3/uL (ref 0.0–0.1)
Immature Granulocytes: 0 %
Lymphocytes Absolute: 2.9 x10E3/uL (ref 0.7–3.1)
Lymphs: 48 %
MCH: 27.3 pg (ref 26.6–33.0)
MCHC: 32.4 g/dL (ref 31.5–35.7)
MCV: 84 fL (ref 79–97)
Monocytes Absolute: 0.4 x10E3/uL (ref 0.1–0.9)
Monocytes: 6 %
Neutrophils Absolute: 2.7 x10E3/uL (ref 1.4–7.0)
Neutrophils: 43 %
Platelets: 313 x10E3/uL (ref 150–450)
RBC: 4.69 x10E6/uL (ref 3.77–5.28)
RDW: 12.4 % (ref 11.7–15.4)
WBC: 6.2 x10E3/uL (ref 3.4–10.8)

## 2023-11-13 LAB — COMPREHENSIVE METABOLIC PANEL WITH GFR
ALT: 11 IU/L (ref 0–32)
AST: 19 IU/L (ref 0–40)
Albumin: 4.4 g/dL (ref 3.9–4.9)
Alkaline Phosphatase: 55 IU/L (ref 41–116)
BUN/Creatinine Ratio: 17 (ref 9–23)
BUN: 12 mg/dL (ref 6–24)
Bilirubin Total: 0.4 mg/dL (ref 0.0–1.2)
CO2: 24 mmol/L (ref 20–29)
Calcium: 9.5 mg/dL (ref 8.7–10.2)
Chloride: 103 mmol/L (ref 96–106)
Creatinine, Ser: 0.72 mg/dL (ref 0.57–1.00)
Globulin, Total: 2.6 g/dL (ref 1.5–4.5)
Glucose: 92 mg/dL (ref 70–99)
Potassium: 4.3 mmol/L (ref 3.5–5.2)
Sodium: 139 mmol/L (ref 134–144)
Total Protein: 7 g/dL (ref 6.0–8.5)
eGFR: 103 mL/min/1.73 (ref >59)

## 2023-11-13 LAB — VITAMIN D 25 HYDROXY (VIT D DEFICIENCY, FRACTURES): Vit D, 25-Hydroxy: 48.3 ng/mL (ref 30.0–100.0)

## 2023-11-13 LAB — HEMOGLOBIN A1C
Est. average glucose Bld gHb Est-mCnc: 114 mg/dL
Hgb A1c MFr Bld: 5.6 % (ref 4.8–5.6)

## 2023-11-13 NOTE — Assessment & Plan Note (Signed)
 No recent exacerbations.

## 2023-11-13 NOTE — Assessment & Plan Note (Signed)
 Obtain DEXA scan bone density

## 2023-11-13 NOTE — Assessment & Plan Note (Signed)
 Periorbital itching likely from seasonal allergic rhinitis recommend intranasal Astelin  for now

## 2023-11-13 NOTE — Assessment & Plan Note (Signed)
 Pap smear done recently return in 3 years for repeat it was normal previously done in [place]

## 2023-11-13 NOTE — Assessment & Plan Note (Signed)
 Suspect from recurrent rhinitis give Astelin 

## 2023-11-13 NOTE — Assessment & Plan Note (Signed)
 Give chronic acyclovir  for 100 mg daily to prevent recurrence

## 2023-11-13 NOTE — Assessment & Plan Note (Signed)
 Check vitamin D  level

## 2023-11-13 NOTE — Assessment & Plan Note (Signed)
Stable at this time monitor 

## 2023-11-13 NOTE — Progress Notes (Signed)
 Let pt know vit d normal. All ither labs normal. No diabetes. No need to take supplement Vi d

## 2023-11-26 ENCOUNTER — Other Ambulatory Visit: Payer: Self-pay

## 2023-11-26 ENCOUNTER — Other Ambulatory Visit (HOSPITAL_COMMUNITY): Payer: Self-pay

## 2023-11-26 ENCOUNTER — Other Ambulatory Visit: Payer: Self-pay | Admitting: Family Medicine

## 2023-11-26 DIAGNOSIS — J302 Other seasonal allergic rhinitis: Secondary | ICD-10-CM

## 2023-11-26 MED ORDER — FLUTICASONE PROPIONATE 50 MCG/ACT NA SUSP
2.0000 | Freq: Every day | NASAL | 0 refills | Status: DC
  Filled 2023-11-26: qty 16, 30d supply, fill #0

## 2023-11-27 ENCOUNTER — Other Ambulatory Visit: Payer: Self-pay

## 2023-12-01 ENCOUNTER — Other Ambulatory Visit: Payer: Self-pay

## 2023-12-01 ENCOUNTER — Other Ambulatory Visit: Payer: Self-pay | Admitting: Physician Assistant

## 2023-12-01 DIAGNOSIS — E039 Hypothyroidism, unspecified: Secondary | ICD-10-CM

## 2023-12-01 MED ORDER — SYNTHROID 50 MCG PO TABS
50.0000 ug | ORAL_TABLET | Freq: Every day | ORAL | 2 refills | Status: AC
  Filled 2023-12-01 – 2023-12-02 (×2): qty 90, 90d supply, fill #0
  Filled 2023-12-20 – 2024-02-26 (×2): qty 90, 90d supply, fill #1

## 2023-12-01 NOTE — Telephone Encounter (Signed)
 Copied from CRM [id number]. Topic: Clinical - Medication Question >> Dec 01, 2023  3:31 PM Antwanette L wrote: Reason for CRM: Marty, a Pharmacologist from [facility] [facility], called to request a refill for the patient's Synthroid  50 mcg tablet. The prescription can be sent to [facility] at [address], [place], [place] [address].

## 2023-12-02 ENCOUNTER — Other Ambulatory Visit (HOSPITAL_COMMUNITY): Payer: Self-pay

## 2023-12-02 ENCOUNTER — Other Ambulatory Visit: Payer: Self-pay

## 2023-12-02 ENCOUNTER — Telehealth: Payer: Self-pay

## 2023-12-02 NOTE — Telephone Encounter (Signed)
 Copied from CRM [id number]. Topic: Clinical - Medication Question >> Dec 01, 2023  3:59 PM Dedra B wrote: Reason for CRM: Pt called to follow up refill request for levothyroxine . Informed pt that refill request can take up to 3 business days. Pt has been out of medication since last week. She is also requesting a 6 mo supply.  Call interpreted by: Silvia ID# [phone]

## 2023-12-20 ENCOUNTER — Other Ambulatory Visit: Payer: Self-pay | Admitting: Family Medicine

## 2023-12-20 DIAGNOSIS — J302 Other seasonal allergic rhinitis: Secondary | ICD-10-CM

## 2023-12-21 ENCOUNTER — Encounter: Payer: Self-pay | Admitting: Radiology

## 2023-12-21 ENCOUNTER — Other Ambulatory Visit (HOSPITAL_COMMUNITY): Payer: Self-pay

## 2023-12-21 ENCOUNTER — Other Ambulatory Visit: Payer: Self-pay

## 2023-12-22 ENCOUNTER — Other Ambulatory Visit (HOSPITAL_COMMUNITY): Payer: Self-pay

## 2023-12-22 MED ORDER — FLUTICASONE PROPIONATE 50 MCG/ACT NA SUSP
2.0000 | Freq: Every day | NASAL | 2 refills | Status: AC
  Filled 2023-12-22: qty 16, 30d supply, fill #0

## 2023-12-22 NOTE — Telephone Encounter (Signed)
 Requested Prescriptions  Pending Prescriptions Disp Refills   fluticasone  (FLONASE ) 50 MCG/ACT nasal spray 16 g 2    Sig: Place 2 sprays into both nostrils daily.     Ear, Nose, and Throat: Nasal Preparations - Corticosteroids Passed - 12/22/2023 12:55 PM      Passed - Valid encounter within last 12 months    Recent Outpatient Visits           1 month ago Vitamin D  deficiency   [facility] Of [place]. [facility] Brien Belvie BRAVO, MD   10 months ago Vitamin D  deficiency   [facility] - A Dept Of [place]. [facility] [employer], [place], [place]   1 year ago Hypothyroidism, unspecified type   [facility] [place] Of [place] Belvie BRAVO, MD   1 year ago Vitamin D  deficiency   [facility] - [facility] Dept Of [place]. [facility] Delbert Clam, MD   2 years ago Vitamin D  deficiency   [facility] [place] - A Dept Of [place]. [facility] Brien Belvie BRAVO, MD

## 2023-12-23 ENCOUNTER — Other Ambulatory Visit: Payer: Self-pay

## 2024-01-13 ENCOUNTER — Other Ambulatory Visit (HOSPITAL_COMMUNITY): Payer: Self-pay

## 2024-02-25 ENCOUNTER — Other Ambulatory Visit (HOSPITAL_COMMUNITY): Payer: Self-pay

## 2024-02-26 ENCOUNTER — Other Ambulatory Visit: Payer: Self-pay

## 2024-02-26 ENCOUNTER — Other Ambulatory Visit (HOSPITAL_COMMUNITY): Payer: Self-pay

## 2024-03-03 ENCOUNTER — Other Ambulatory Visit (HOSPITAL_COMMUNITY): Payer: Self-pay

## 2024-03-03 ENCOUNTER — Other Ambulatory Visit: Payer: Self-pay

## 2024-03-09 ENCOUNTER — Ambulatory Visit: Payer: Self-pay | Admitting: *Deleted

## 2024-05-16 ENCOUNTER — Ambulatory Visit: Admitting: Family Medicine

## 2024-06-08 ENCOUNTER — Other Ambulatory Visit (HOSPITAL_BASED_OUTPATIENT_CLINIC_OR_DEPARTMENT_OTHER)
